# Patient Record
Sex: Female | Born: 1960 | Race: White | Hispanic: No | Marital: Married | Smoking: Never smoker
Health system: Southern US, Community
[De-identification: ages and names within clinical notes are randomized; demographics above are authoritative.]

## PROBLEM LIST (undated history)

## (undated) DIAGNOSIS — R569 Unspecified convulsions: Secondary | ICD-10-CM

## (undated) DIAGNOSIS — M1990 Unspecified osteoarthritis, unspecified site: Secondary | ICD-10-CM

## (undated) DIAGNOSIS — F909 Attention-deficit hyperactivity disorder, unspecified type: Secondary | ICD-10-CM

## (undated) DIAGNOSIS — Z8489 Family history of other specified conditions: Secondary | ICD-10-CM

## (undated) HISTORY — PX: BREAST SURGERY: SHX581

## (undated) HISTORY — PX: HAND SURGERY: SHX662

---

## 2005-08-09 ENCOUNTER — Ambulatory Visit: Payer: Self-pay | Admitting: Unknown Physician Specialty

## 2011-01-23 ENCOUNTER — Encounter: Payer: Self-pay | Admitting: Rheumatology

## 2011-01-30 ENCOUNTER — Encounter: Payer: Self-pay | Admitting: Rheumatology

## 2013-05-13 ENCOUNTER — Ambulatory Visit: Payer: Self-pay | Admitting: Gastroenterology

## 2013-05-14 LAB — PATHOLOGY REPORT

## 2017-04-17 ENCOUNTER — Other Ambulatory Visit: Payer: Self-pay | Admitting: Rheumatology

## 2017-04-17 DIAGNOSIS — M25511 Pain in right shoulder: Principal | ICD-10-CM

## 2017-04-17 DIAGNOSIS — G8929 Other chronic pain: Secondary | ICD-10-CM

## 2017-04-20 ENCOUNTER — Ambulatory Visit
Admission: RE | Admit: 2017-04-20 | Discharge: 2017-04-20 | Disposition: A | Discharge status: Home / Self Care | Payer: BC Managed Care – PPO | Source: Ambulatory Visit | Attending: Rheumatology | Admitting: Rheumatology

## 2017-04-20 ENCOUNTER — Encounter: Payer: Self-pay | Admitting: Radiology

## 2017-04-20 DIAGNOSIS — M25411 Effusion, right shoulder: Secondary | ICD-10-CM | POA: Insufficient documentation

## 2017-04-20 DIAGNOSIS — M25511 Pain in right shoulder: Secondary | ICD-10-CM | POA: Diagnosis present

## 2017-04-20 DIAGNOSIS — M7551 Bursitis of right shoulder: Secondary | ICD-10-CM | POA: Insufficient documentation

## 2017-04-20 DIAGNOSIS — G8929 Other chronic pain: Secondary | ICD-10-CM

## 2019-08-13 ENCOUNTER — Other Ambulatory Visit: Payer: Self-pay | Admitting: Surgery

## 2019-09-02 ENCOUNTER — Other Ambulatory Visit
Admission: RE | Admit: 2019-09-02 | Discharge: 2019-09-02 | Disposition: A | Discharge status: Home / Self Care | Payer: BC Managed Care – PPO | Source: Ambulatory Visit | Attending: Surgery | Admitting: Surgery

## 2019-09-02 ENCOUNTER — Other Ambulatory Visit: Payer: Self-pay

## 2019-09-02 DIAGNOSIS — Z01810 Encounter for preprocedural cardiovascular examination: Secondary | ICD-10-CM | POA: Insufficient documentation

## 2019-09-02 DIAGNOSIS — R001 Bradycardia, unspecified: Secondary | ICD-10-CM | POA: Diagnosis not present

## 2019-09-02 DIAGNOSIS — Z01812 Encounter for preprocedural laboratory examination: Secondary | ICD-10-CM | POA: Diagnosis present

## 2019-09-02 HISTORY — DX: Unspecified osteoarthritis, unspecified site: M19.90

## 2019-09-02 LAB — URINALYSIS, ROUTINE W REFLEX MICROSCOPIC
Bacteria, UA: NONE SEEN
Bilirubin Urine: NEGATIVE
Glucose, UA: NEGATIVE mg/dL
Ketones, ur: NEGATIVE mg/dL
Leukocytes,Ua: NEGATIVE
Nitrite: NEGATIVE
Protein, ur: NEGATIVE mg/dL
Specific Gravity, Urine: 1.01 (ref 1.005–1.030)
Squamous Epithelial / HPF: NONE SEEN (ref 0–5)
WBC, UA: NONE SEEN WBC/hpf (ref 0–5)
pH: 5 (ref 5.0–8.0)

## 2019-09-02 LAB — COMPREHENSIVE METABOLIC PANEL
ALT: 20 U/L (ref 0–44)
AST: 23 U/L (ref 15–41)
Albumin: 3.9 g/dL (ref 3.5–5.0)
Alkaline Phosphatase: 46 U/L (ref 38–126)
Anion gap: 8 (ref 5–15)
BUN: 16 mg/dL (ref 6–20)
CO2: 25 mmol/L (ref 22–32)
Calcium: 8.5 mg/dL — ABNORMAL LOW (ref 8.9–10.3)
Chloride: 102 mmol/L (ref 98–111)
Creatinine, Ser: 0.61 mg/dL (ref 0.44–1.00)
GFR calc Af Amer: >60 mL/min (ref >60)
GFR calc non Af Amer: >60 mL/min (ref >60)
Glucose, Bld: 94 mg/dL (ref 70–99)
Potassium: 4.3 mmol/L (ref 3.5–5.1)
Sodium: 135 mmol/L (ref 135–145)
Total Bilirubin: 0.6 mg/dL (ref 0.3–1.2)
Total Protein: 6.8 g/dL (ref 6.5–8.1)

## 2019-09-02 LAB — CBC WITH DIFFERENTIAL/PLATELET
Abs Immature Granulocytes: 0.01 10*3/uL (ref 0.00–0.07)
Basophils Absolute: 0 10*3/uL (ref 0.0–0.1)
Basophils Relative: 1 %
Eosinophils Absolute: 0 10*3/uL (ref 0.0–0.5)
Eosinophils Relative: 1 %
HCT: 38.2 % (ref 36.0–46.0)
Hemoglobin: 12.8 g/dL (ref 12.0–15.0)
Immature Granulocytes: 0 %
Lymphocytes Relative: 21 %
Lymphs Abs: 0.9 10*3/uL (ref 0.7–4.0)
MCH: 30.4 pg (ref 26.0–34.0)
MCHC: 33.5 g/dL (ref 30.0–36.0)
MCV: 90.7 fL (ref 80.0–100.0)
Monocytes Absolute: 0.8 10*3/uL (ref 0.1–1.0)
Monocytes Relative: 18 %
Neutro Abs: 2.5 10*3/uL (ref 1.7–7.7)
Neutrophils Relative %: 59 %
Platelets: 165 10*3/uL (ref 150–400)
RBC: 4.21 MIL/uL (ref 3.87–5.11)
RDW: 11.9 % (ref 11.5–15.5)
WBC: 4.2 10*3/uL (ref 4.0–10.5)
nRBC: 0 % (ref 0.0–0.2)

## 2019-09-02 LAB — TYPE AND SCREEN
ABO/RH(D): B POS
Antibody Screen: NEGATIVE

## 2019-09-02 LAB — SURGICAL PCR SCREEN
MRSA, PCR: NEGATIVE
Staphylococcus aureus: NEGATIVE

## 2019-09-02 NOTE — Pre-Procedure Instructions (Signed)
Pt states she has some normal sinus issues. Temperature is monitor daily. Pt instructed to notify MD if symptoms worsen.

## 2019-09-02 NOTE — Patient Instructions (Signed)
Your procedure is scheduled on: Tuesday September 08, 2019 Report to Day Surgery on the 2nd floor of the [facility]. To find out your arrival time, please call [phone] between 1PM - 3PM on: Monday September 07, 2019  REMEMBER: Instructions that are not followed completely may result in serious medical risk, up to and including death; or upon the discretion of your surgeon and anesthesiologist your surgery may need to be rescheduled.  Do not eat food after midnight the night before surgery.  No gum chewing, lozengers or hard candies.  You may however, drink CLEAR liquids up to 2 hours before you are scheduled to arrive for your surgery. Do not drink anything within 2 hours of the start of your surgery.  Clear liquids include: - water  - apple juice without pulp - CLEAR gatorade - black coffee or tea (Do NOT add milk or creamers to the coffee or tea) Do NOT drink anything that is not on this list.  Type 1 and Type 2 diabetics should only drink water.  ENSURE PRE-SURGERY CARBOHYDRATE DRINK:  Complete drinking 3 hours prior to surgery.  No Alcohol for 24 hours before or after surgery.  No Smoking including e-cigarettes for 24 hours prior to surgery.  No chewable tobacco products for at least 6 hours prior to surgery.  No nicotine patches on the day of surgery.  On the morning of surgery brush your teeth with toothpaste and water, you may rinse your mouth with mouthwash if you wish. Do not swallow any toothpaste or mouthwash.  Notify your doctor if there is any change in your medical condition (cold, fever, infection).  Do not wear jewelry, make-up, hairpins, clips or nail polish.  Do not wear lotions, powders, or perfumes OR DEODORANT  Do not shave 48 hours prior to surgery.   Contacts and dentures may not be worn into surgery.  Do not bring valuables to the hospital, including drivers license, insurance or credit cards.  [facility] is not responsible for any belongings  or valuables.   TAKE THESE MEDICATIONS THE MORNING OF SURGERY: ZOLOFT  Use CHG Soap  as directed on instruction sheet.  Stop Anti-inflammatories (NSAIDS) such as Advil, Aleve, Ibuprofen, Motrin, Naproxen, Naprosyn and Aspirin based products such as Excedrin, Goodys Powder, BC Powder. (May take Tylenol or Acetaminophen if needed.)  Stop ANY OVER THE COUNTER supplements until after surgery. (May continue Vitamin D, Vitamin B, and multivitamin.)  Wear comfortable clothing (specific to your surgery type) to the hospital.  Plan for stool softeners for home use.  If you are being admitted to the hospital overnight, leave your suitcase in the car. After surgery it may be brought to your room.  If you are being discharged the day of surgery, you will not be allowed to drive home. You will need a responsible adult to drive you home and stay with you that night.   If you are taking public transportation, you will need to have a responsible adult with you. Please confirm with your physician that it is acceptable to use public transportation.   Please call [phone] if you have any questions about these instructions.

## 2019-09-02 NOTE — Pre-Procedure Instructions (Signed)
Abnormal urinalysis faxed to Dr Roland Rack.

## 2019-09-04 ENCOUNTER — Other Ambulatory Visit
Admission: RE | Admit: 2019-09-04 | Discharge: 2019-09-04 | Disposition: A | Discharge status: Home / Self Care | Payer: BC Managed Care – PPO | Source: Ambulatory Visit | Attending: Surgery | Admitting: Surgery

## 2019-09-04 DIAGNOSIS — Z01812 Encounter for preprocedural laboratory examination: Secondary | ICD-10-CM | POA: Insufficient documentation

## 2019-09-04 DIAGNOSIS — U071 COVID-19: Secondary | ICD-10-CM | POA: Diagnosis not present

## 2019-09-04 LAB — SARS CORONAVIRUS 2 (TAT 6-24 HRS): SARS Coronavirus 2: POSITIVE — AB

## 2019-09-07 NOTE — Progress Notes (Signed)
Called and left message on [facility] line (Dr Poggi's surgery coordinator) about patients positive COVID-19 test.

## 2019-09-08 ENCOUNTER — Encounter: Admission: RE | Payer: Self-pay | Source: Home / Self Care

## 2019-09-08 ENCOUNTER — Inpatient Hospital Stay: Admission: RE | Admit: 2019-09-08 | Payer: BC Managed Care – PPO | Source: Home / Self Care | Admitting: Surgery

## 2019-09-08 SURGERY — ARTHROPLASTY, HIP, TOTAL,POSTERIOR APPROACH
Anesthesia: Choice | Site: Hip | Laterality: Right

## 2019-09-22 ENCOUNTER — Other Ambulatory Visit: Payer: Self-pay | Admitting: Surgery

## 2019-10-01 ENCOUNTER — Other Ambulatory Visit: Payer: BC Managed Care – PPO

## 2019-10-05 NOTE — Pre-Procedure Instructions (Signed)
Talked to patient to clarify instructions for upcoming surgery.  We will repeat the T&S and [place] on DOS but previous labs are OK for surgical case. States never had COVID symptoms. But discovered she was positive

## 2019-10-06 ENCOUNTER — Ambulatory Visit: Payer: BC Managed Care – PPO

## 2019-10-06 ENCOUNTER — Other Ambulatory Visit: Payer: Self-pay

## 2019-10-06 ENCOUNTER — Ambulatory Visit
Admission: RE | Admit: 2019-10-06 | Discharge: 2019-10-06 | Disposition: A | Discharge status: Home / Self Care | Payer: BC Managed Care – PPO | Attending: Surgery | Admitting: Surgery

## 2019-10-06 ENCOUNTER — Encounter: Payer: Self-pay | Admitting: Surgery

## 2019-10-06 ENCOUNTER — Ambulatory Visit: Payer: BC Managed Care – PPO | Admitting: Family

## 2019-10-06 ENCOUNTER — Encounter
Admission: RE | Disposition: A | Discharge status: Home / Self Care | Payer: Self-pay | Source: Home / Self Care | Attending: Surgery

## 2019-10-06 DIAGNOSIS — Z809 Family history of malignant neoplasm, unspecified: Secondary | ICD-10-CM | POA: Diagnosis not present

## 2019-10-06 DIAGNOSIS — Z885 Allergy status to narcotic agent status: Secondary | ICD-10-CM | POA: Insufficient documentation

## 2019-10-06 DIAGNOSIS — Z79899 Other long term (current) drug therapy: Secondary | ICD-10-CM | POA: Diagnosis not present

## 2019-10-06 DIAGNOSIS — Z8261 Family history of arthritis: Secondary | ICD-10-CM | POA: Insufficient documentation

## 2019-10-06 DIAGNOSIS — M1611 Unilateral primary osteoarthritis, right hip: Secondary | ICD-10-CM | POA: Diagnosis present

## 2019-10-06 DIAGNOSIS — Z96641 Presence of right artificial hip joint: Secondary | ICD-10-CM

## 2019-10-06 HISTORY — PX: TOTAL HIP ARTHROPLASTY: SHX124

## 2019-10-06 LAB — TYPE AND SCREEN
ABO/RH(D): B POS
Antibody Screen: NEGATIVE

## 2019-10-06 SURGERY — ARTHROPLASTY, HIP, TOTAL,POSTERIOR APPROACH
Anesthesia: Spinal | Site: Hip | Laterality: Right

## 2019-10-06 MED ORDER — CEFAZOLIN SODIUM-DEXTROSE 2-4 GM/100ML-% IV SOLN: 2.0000 g | Freq: Four times a day (QID) | INTRAVENOUS | Status: DC

## 2019-10-06 MED ORDER — APIXABAN 2.5 MG PO TABS: 2.5000 mg | ORAL_TABLET | Freq: Two times a day (BID) | ORAL | 0 refills | Status: DC

## 2019-10-06 MED ORDER — PROMETHAZINE HCL 25 MG/ML IJ SOLN: 6.2500 mg | INTRAMUSCULAR | Status: DC | PRN

## 2019-10-06 MED ORDER — ACETAMINOPHEN 500 MG PO TABS: 1000.0000 mg | ORAL_TABLET | Freq: Four times a day (QID) | ORAL | Status: DC

## 2019-10-06 MED ORDER — OXYCODONE HCL 5 MG PO TABS: 5.0000 mg | ORAL_TABLET | ORAL | 0 refills | Status: DC | PRN

## 2019-10-06 MED ORDER — ACETAMINOPHEN 500 MG PO TABS
ORAL_TABLET | ORAL | Status: AC
  Administered 2019-10-06: 1000 mg via ORAL
  Filled 2019-10-06: qty 2

## 2019-10-06 MED ORDER — ONDANSETRON HCL 4 MG PO TABS: 4.0000 mg | ORAL_TABLET | Freq: Four times a day (QID) | ORAL | Status: DC | PRN

## 2019-10-06 MED ORDER — FAMOTIDINE 20 MG PO TABS
ORAL_TABLET | ORAL | Status: AC
  Administered 2019-10-06: 20 mg via ORAL
  Filled 2019-10-06: qty 1

## 2019-10-06 MED ORDER — KETOROLAC TROMETHAMINE 30 MG/ML IJ SOLN
INTRAMUSCULAR | Status: AC
  Administered 2019-10-06: 30 mg via INTRAVENOUS
  Filled 2019-10-06: qty 1

## 2019-10-06 MED ORDER — CEFAZOLIN SODIUM-DEXTROSE 2-4 GM/100ML-% IV SOLN
INTRAVENOUS | Status: AC
  Administered 2019-10-06: 2000 mg
  Filled 2019-10-06: qty 100

## 2019-10-06 MED ORDER — MIDAZOLAM HCL 5 MG/5ML IJ SOLN
INTRAMUSCULAR | Status: DC | PRN
  Administered 2019-10-06: 2 mg via INTRAVENOUS

## 2019-10-06 MED ORDER — CEFAZOLIN SODIUM-DEXTROSE 2-4 GM/100ML-% IV SOLN
2.0000 g | INTRAVENOUS | Status: AC
  Administered 2019-10-06: 2 g via INTRAVENOUS

## 2019-10-06 MED ORDER — OXYCODONE HCL 5 MG PO TABS: 5.0000 mg | ORAL_TABLET | ORAL | Status: DC | PRN

## 2019-10-06 MED ORDER — MIDAZOLAM HCL 2 MG/2ML IJ SOLN
INTRAMUSCULAR | Status: AC
  Filled 2019-10-06: qty 2

## 2019-10-06 MED ORDER — EPINEPHRINE PF 1 MG/ML IJ SOLN
INTRAMUSCULAR | Status: AC
  Filled 2019-10-06: qty 1

## 2019-10-06 MED ORDER — SODIUM CHLORIDE FLUSH 0.9 % IV SOLN
INTRAVENOUS | Status: AC
  Filled 2019-10-06: qty 40

## 2019-10-06 MED ORDER — PROPOFOL 500 MG/50ML IV EMUL
INTRAVENOUS | Status: DC | PRN
  Administered 2019-10-06: 50 ug/kg/min via INTRAVENOUS

## 2019-10-06 MED ORDER — PHENYLEPHRINE HCL (PRESSORS) 10 MG/ML IV SOLN
INTRAVENOUS | Status: DC | PRN
  Administered 2019-10-06: 50 ug via INTRAVENOUS

## 2019-10-06 MED ORDER — FENTANYL CITRATE (PF) 100 MCG/2ML IJ SOLN: 25.0000 ug | INTRAMUSCULAR | Status: DC | PRN

## 2019-10-06 MED ORDER — PROPOFOL 500 MG/50ML IV EMUL
INTRAVENOUS | Status: AC
  Filled 2019-10-06: qty 50

## 2019-10-06 MED ORDER — BUPIVACAINE LIPOSOME 1.3 % IJ SUSP
INTRAMUSCULAR | Status: AC
  Filled 2019-10-06: qty 20

## 2019-10-06 MED ORDER — TRAMADOL HCL 50 MG PO TABS
ORAL_TABLET | ORAL | Status: AC
  Filled 2019-10-06: qty 1

## 2019-10-06 MED ORDER — FENTANYL CITRATE (PF) 100 MCG/2ML IJ SOLN
INTRAMUSCULAR | Status: AC
  Filled 2019-10-06: qty 2

## 2019-10-06 MED ORDER — CHLORHEXIDINE GLUCONATE 4 % EX LIQD: 60.0000 mL | Freq: Once | CUTANEOUS | Status: DC

## 2019-10-06 MED ORDER — CEFAZOLIN SODIUM-DEXTROSE 2-4 GM/100ML-% IV SOLN
INTRAVENOUS | Status: AC
  Filled 2019-10-06: qty 100

## 2019-10-06 MED ORDER — POTASSIUM CHLORIDE IN NACL 20-0.9 MEQ/L-% IV SOLN: INTRAVENOUS | Status: DC

## 2019-10-06 MED ORDER — SODIUM CHLORIDE 0.9 % IV SOLN
INTRAVENOUS | Status: DC | PRN
  Administered 2019-10-06: 60 mL

## 2019-10-06 MED ORDER — EPHEDRINE SULFATE 50 MG/ML IJ SOLN
INTRAMUSCULAR | Status: DC | PRN
  Administered 2019-10-06 (×2): 10 mg via INTRAVENOUS
  Administered 2019-10-06: 5 mg via INTRAVENOUS

## 2019-10-06 MED ORDER — FAMOTIDINE 20 MG PO TABS: 20.0000 mg | ORAL_TABLET | Freq: Once | ORAL | Status: AC

## 2019-10-06 MED ORDER — TRANEXAMIC ACID 1000 MG/10ML IV SOLN
INTRAVENOUS | Status: AC
  Filled 2019-10-06: qty 10

## 2019-10-06 MED ORDER — BUPIVACAINE HCL (PF) 0.5 % IJ SOLN
INTRAMUSCULAR | Status: DC | PRN
  Administered 2019-10-06: 2.5 mL

## 2019-10-06 MED ORDER — KETOROLAC TROMETHAMINE 30 MG/ML IJ SOLN: 30.0000 mg | Freq: Once | INTRAMUSCULAR | Status: AC

## 2019-10-06 MED ORDER — KETOROLAC TROMETHAMINE 15 MG/ML IJ SOLN: 15.0000 mg | Freq: Four times a day (QID) | INTRAMUSCULAR | Status: DC

## 2019-10-06 MED ORDER — LACTATED RINGERS IV SOLN: INTRAVENOUS | Status: DC

## 2019-10-06 MED ORDER — METOCLOPRAMIDE HCL 5 MG/ML IJ SOLN: 5.0000 mg | Freq: Three times a day (TID) | INTRAMUSCULAR | Status: DC | PRN

## 2019-10-06 MED ORDER — BUPIVACAINE HCL (PF) 0.5 % IJ SOLN
INTRAMUSCULAR | Status: AC
  Filled 2019-10-06: qty 30

## 2019-10-06 MED ORDER — SODIUM CHLORIDE 0.9 % BOLUS PEDS
250.0000 mL | Freq: Once | INTRAVENOUS | Status: AC
  Administered 2019-10-06: 10:00:00 250 mL via INTRAVENOUS

## 2019-10-06 MED ORDER — ONDANSETRON HCL 4 MG/2ML IJ SOLN: 4.0000 mg | Freq: Four times a day (QID) | INTRAMUSCULAR | Status: DC | PRN

## 2019-10-06 MED ORDER — METOCLOPRAMIDE HCL 10 MG PO TABS: 5.0000 mg | ORAL_TABLET | Freq: Three times a day (TID) | ORAL | Status: DC | PRN

## 2019-10-06 MED ORDER — BUPIVACAINE-EPINEPHRINE (PF) 0.5% -1:200000 IJ SOLN
INTRAMUSCULAR | Status: DC | PRN
  Administered 2019-10-06: 30 mL via PERINEURAL

## 2019-10-06 MED ORDER — OXYCODONE HCL 5 MG PO TABS
ORAL_TABLET | ORAL | Status: AC
  Administered 2019-10-06: 5 mg via ORAL
  Filled 2019-10-06: qty 1

## 2019-10-06 MED ORDER — TRAMADOL HCL 50 MG PO TABS: 50.0000 mg | ORAL_TABLET | Freq: Four times a day (QID) | ORAL | 0 refills | Status: AC | PRN

## 2019-10-06 MED ORDER — SODIUM CHLORIDE 0.9 % BOLUS PEDS
250.0000 mL | Freq: Once | INTRAVENOUS | Status: AC
  Administered 2019-10-06: 250 mL via INTRAVENOUS

## 2019-10-06 SURGICAL SUPPLY — 56 items
BLADE SAGITTAL WIDE XTHICK NO (BLADE) ×3 IMPLANT
BLADE SURG SZ20 CARB STEEL (BLADE) ×3 IMPLANT
CANISTER SUCT 1200ML W/VALVE (MISCELLANEOUS) ×3 IMPLANT
CANISTER SUCT 3000ML PPV (MISCELLANEOUS) ×6 IMPLANT
CHLORAPREP W/TINT 26 (MISCELLANEOUS) ×3 IMPLANT
COVER WAND RF STERILE (DRAPES) ×3 IMPLANT
DRAPE 3/4 80X56 (DRAPES) ×3 IMPLANT
DRAPE INCISE IOBAN 66X60 STRL (DRAPES) ×3 IMPLANT
DRAPE SPLIT 6X30 W/TAPE (DRAPES) ×3 IMPLANT
DRAPE SURG 17X11 SM STRL (DRAPES) ×6 IMPLANT
DRSG OPSITE POSTOP 4X10 (GAUZE/BANDAGES/DRESSINGS) ×3 IMPLANT
ELECT BLADE 6.5 EXT (BLADE) ×3 IMPLANT
ELECT CAUTERY BLADE 6.4 (BLADE) ×3 IMPLANT
GLOVE BIO SURGEON STRL SZ7.5 (GLOVE) ×12 IMPLANT
GLOVE BIO SURGEON STRL SZ8 (GLOVE) ×12 IMPLANT
GLOVE BIOGEL PI IND STRL 8 (GLOVE) ×1 IMPLANT
GLOVE BIOGEL PI INDICATOR 8 (GLOVE) ×2
GLOVE INDICATOR 8.0 STRL GRN (GLOVE) ×3 IMPLANT
GOWN STRL REUS W/ TWL LRG LVL3 (GOWN DISPOSABLE) ×1 IMPLANT
GOWN STRL REUS W/ TWL XL LVL3 (GOWN DISPOSABLE) ×1 IMPLANT
GOWN STRL REUS W/TWL LRG LVL3 (GOWN DISPOSABLE) ×2
GOWN STRL REUS W/TWL XL LVL3 (GOWN DISPOSABLE) ×2
HEAD FEM -6XOFST 36XMDLR (Orthopedic Implant) ×1 IMPLANT
HEAD MODULAR 36MM (Orthopedic Implant) ×2 IMPLANT
HIP SHELL ACETAB 3H 50MM (Hips) ×3 IMPLANT
HOOD PEEL AWAY FLYTE STAYCOOL (MISCELLANEOUS) ×9 IMPLANT
KIT TURNOVER KIT A (KITS) ×3 IMPLANT
LINER ACE G7 36 SZ D HIGH WALL (Liner) ×3 IMPLANT
NDL SAFETY ECLIPSE 18X1.5 (NEEDLE) ×2 IMPLANT
NEEDLE FILTER BLUNT 18X 1/2SAF (NEEDLE) ×2
NEEDLE FILTER BLUNT 18X1 1/2 (NEEDLE) ×1 IMPLANT
NEEDLE HYPO 18GX1.5 SHARP (NEEDLE) ×4
NEEDLE SPNL 20GX3.5 QUINCKE YW (NEEDLE) ×3 IMPLANT
PACK HIP PROSTHESIS (MISCELLANEOUS) ×3 IMPLANT
PILLOW ABDUCTION FOAM SM (MISCELLANEOUS) ×3 IMPLANT
PIN STEINMAN 3/16 (PIN) ×3 IMPLANT
PIN STEINMANN 3/16X9 BAY 6PK (Pin) ×1 IMPLANT
PULSAVAC PLUS IRRIG FAN TIP (DISPOSABLE) ×3
SHELL ACETAB HIP 3H 50MM (Hips) ×1 IMPLANT
SOL .9 NS 3000ML IRR  AL (IV SOLUTION) ×2
SOL .9 NS 3000ML IRR UROMATIC (IV SOLUTION) ×1 IMPLANT
SPONGE LAP 18X18 RF (DISPOSABLE) ×3 IMPLANT
ST PIN 3/16X9 BAY 6PK (Pin) ×3 IMPLANT
STAPLER SKIN PROX 35W (STAPLE) ×3 IMPLANT
STEM COLLARLESS 11MM X 135MM (Stem) ×3 IMPLANT
SUT TICRON 2-0 30IN 311381 (SUTURE) ×9 IMPLANT
SUT VIC AB 0 CT1 36 (SUTURE) ×3 IMPLANT
SUT VIC AB 1 CT1 36 (SUTURE) ×3 IMPLANT
SUT VIC AB 2-0 CT1 (SUTURE) ×9 IMPLANT
SYR 10ML LL (SYRINGE) ×3 IMPLANT
SYR 20ML LL LF (SYRINGE) ×3 IMPLANT
SYR 30ML LL (SYRINGE) ×6 IMPLANT
TAPE TRANSPORE STRL 2 31045 (GAUZE/BANDAGES/DRESSINGS) ×3 IMPLANT
TIP FAN IRRIG PULSAVAC PLUS (DISPOSABLE) ×1 IMPLANT
TRAY FOLEY MTR SLVR 16FR STAT (SET/KITS/TRAYS/PACK) IMPLANT
WATER STERILE IRR 1000ML POUR (IV SOLUTION) ×3 IMPLANT

## 2019-10-06 NOTE — Anesthesia Preprocedure Evaluation (Signed)
Anesthesia Evaluation  Patient identified by MRN, date of birth, ID band Patient awake    Reviewed: Allergy & Precautions, H&P , NPO status , Patient's Chart, lab work & pertinent test results, reviewed documented beta blocker date and time   History of Anesthesia Complications Negative for: history of anesthetic complications  Airway Mallampati: II  TM Distance: >3 FB Neck ROM: full    Dental  (+) Dental Advidsory Given, Caps, Teeth Intact   Pulmonary neg pulmonary ROS,    Pulmonary exam normal        Cardiovascular Exercise Tolerance: Good negative cardio ROS Normal cardiovascular exam     Neuro/Psych negative neurological ROS  negative psych ROS   GI/Hepatic negative GI ROS, Neg liver ROS,   Endo/Other  negative endocrine ROS  Renal/GU negative Renal ROS  negative genitourinary   Musculoskeletal   Abdominal   Peds  Hematology negative hematology ROS (+)   Anesthesia Other Findings Past Medical History: No date: Arthritis   Reproductive/Obstetrics negative OB ROS                             Anesthesia Physical Anesthesia Plan  ASA: I  Anesthesia Plan: Spinal   Post-op Pain Management:    Induction:   PONV Risk Score and Plan: Propofol infusion and TIVA  Airway Management Planned: Natural Airway and Simple Face Mask  Additional Equipment:   Intra-op Plan:   Post-operative Plan:   Informed Consent: I have reviewed the patients History and Physical, chart, labs and discussed the procedure including the risks, benefits and alternatives for the proposed anesthesia with the patient or authorized representative who has indicated his/her understanding and acceptance.     Dental Advisory Given  Plan Discussed with: Anesthesiologist, CRNA and Surgeon  Anesthesia Plan Comments:         Anesthesia Quick Evaluation

## 2019-10-06 NOTE — Progress Notes (Signed)
Physical Therapy Treatment Patient Details Name: Maria Donaldson MRN: [id number] DOB: [birth year] Today's Date: 10/06/2019    History of Present Illness Pt is a 59 y.o. 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  PMH includes arthritis.    PT Comments    Mild increased R quadriceps contraction noted but significant R quadriceps weakness still present (pt unable to perform SAQ or LAQ).  Per discussion with MD Poggi, plan to utilize R knee immobilizer with functional mobility d/t R quadriceps weakness to prevent R knee buckling (plan to discharge pt home with KI per discussion with MD Poggi).  Pt reporting 4/10 R hip pain during session (pt reports pain improved a lot compared to prior to surgery).  Pt educated on R LE knee immobilizer use including donning and doffing, positioning of KI, and wear instructions (therapist recommended to pt to wear R knee immobilizer for all functional mobility until follow-up with physical therapy for safety--R quadriceps strength to be reassessed with HHPT to determine need for continued KI use): pt verbalizing appropriate understanding.  Reviewed posterior hip precautions (including safe R LE positioning at rest and with activity d/t posterior hip precautions): pt verbalizing appropriate understanding.  Able to walk at least 80 feet with RW CGA (R KI donned) and navigate steps safely (with R KI donned)--see below for details.  Pt reports plan to keep BSC next to her bed at night.  Car transfers reviewed in prior PT session this afternoon and pt verbalizing appropriate understanding.  Pt reporting no concerns with discharge home today and pt able to appropriately verbalize safety precautions; pt also reports her husband is able to assist her with stairs, bed mobility, and anything else she needs for safe mobility and safe discharge home.  Youth sized RW issued to pt (and fit to pt for appropriate size).   Follow Up Recommendations  Home health PT;Supervision/Assistance - 24  hour     Equipment Recommendations  Rolling walker with 5" wheels(youth sized)    Recommendations for Other Services       Precautions / Restrictions Precautions Precautions: Posterior Hip;Fall Precaution Booklet Issued: Yes (comment) Restrictions Weight Bearing Restrictions: Yes RLE Weight Bearing: Weight bearing as tolerated(with walker)    Mobility  Bed Mobility          Transfers Overall transfer level: Needs assistance Equipment used: Rolling walker (2 wheeled) Transfers: Sit to/from Stand;Stand Pivot Transfers Sit to Stand: Supervision Stand pivot transfers: Supervision       General transfer comment: initial vc's for UE/LE placement and posterior hip precautions but then pt able to perform safely without further cueing  Ambulation/Gait Ambulation/Gait assistance: Min guard Gait Distance (Feet): 80 Feet Assistive device: Rolling walker (2 wheeled)(youth sized)   Gait velocity: decreased   General Gait Details: pt noted with difficulty coordinating stepping with R LE (pt reporting d/t numbness still in R LE) so pt given cueing to take shorter steps and improved gait technique noted; steady with RW; KI utilized   Stairs Stairs: Yes Stairs assistance: Min guard Stair Management: Step to pattern;Forwards Number of Stairs: (4 steps; 2 steps) General stair comments: ascended/descended 2 steps with B railings and then 2 steps with RW (forwards) with initial cueing for technique; then ascended 2 steps with R hand hold assist (and using L UE simulating pushing against wall) and then descended 2 steps with B UE support on therapists shoulders; KI utilized for steps   Wheelchair Mobility    Modified Rankin (Stroke Patients Only)  Balance Overall balance assessment: Needs assistance Sitting-balance support: No upper extremity supported;Feet supported Sitting balance-Leahy Scale: Normal Sitting balance - Comments: steady sitting reaching outside BOS    Standing balance support: No upper extremity supported Standing balance-Leahy Scale: Good Standing balance comment: steady standing reaching within BOS (with R KI donned)                            Cognition Arousal/Alertness: Awake/alert Behavior During Therapy: WFL for tasks assessed/performed Overall Cognitive Status: Within Functional Limits for tasks assessed                                        Exercises     General Comments        Pertinent Vitals/Pain Pain Assessment: 0-10 Pain Score: 4  Pain Location: R hip/thigh Pain Descriptors / Indicators: Sore Pain Intervention(s): Limited activity within patient's tolerance;Monitored during session;Repositioned;RN gave pain meds during session;Ice applied  Vitals (HR and O2 on room air) stable and WFL throughout treatment session.    Home Living Family/patient expects to be discharged to:: Private residence Living Arrangements: Spouse/significant other Available Help at Discharge: Family Type of Home: House Home Access: Stairs to enter Entrance Stairs-Rails: None Home Layout: (split level type home) Home Equipment: Bedside commode;Grab bars - tub/shower      Prior Function Level of Independence: Independent      Comments: Works as a Oncologist.   PT Goals (current goals can now be found in the care plan section) Acute Rehab PT Goals Patient Stated Goal: to go home PT Goal Formulation: With patient Time For Goal Achievement: 10/20/19 Potential to Achieve Goals: Good Progress towards PT goals: Progressing toward goals    Frequency    BID      PT Plan Current plan remains appropriate    Co-evaluation              AM-PAC PT "6 Clicks" Mobility   Outcome Measure  Help needed turning from your back to your side while in a flat bed without using bedrails?: A Little Help needed moving from lying on your back to sitting on the side of a flat bed without using  bedrails?: A Little Help needed moving to and from a bed to a chair (including a wheelchair)?: A Little Help needed standing up from a chair using your arms (e.g., wheelchair or bedside chair)?: A Little Help needed to walk in hospital room?: A Little Help needed climbing 3-5 steps with a railing? : A Little 6 Click Score: 18    End of Session Equipment Utilized During Treatment: Gait belt Activity Tolerance: Patient tolerated treatment well Patient left: in chair;with call bell/phone within reach;with nursing/sitter in room Nurse Communication: Mobility status;Precautions;Weight bearing status;Other (comment)(R KI use) PT Visit Diagnosis: Other abnormalities of gait and mobility (R26.89);Muscle weakness (generalized) (M62.81);Difficulty in walking, not elsewhere classified (R26.2);Pain Pain - Right/Left: Right Pain - part of body: Hip     Time: 1545-1630 PT Time Calculation (min) (ACUTE ONLY): 45 min  Charges:  $Gait Training: 23-37 mins $Therapeutic Activity: 8-22 mins                     Leitha Bleak, PT 10/06/19, 5:55 PM

## 2019-10-06 NOTE — Anesthesia Procedure Notes (Signed)
Spinal  Patient location during procedure: OR Start time: 10/06/2019 7:39 AM End time: 10/06/2019 7:42 AM Staffing Resident/CRNA: Gentry Fitz, CRNA Preanesthetic Checklist Completed: patient identified, IV checked, site marked, risks and benefits discussed, surgical consent, monitors and equipment checked, pre-op evaluation and timeout performed Spinal Block Patient position: sitting Prep: Betadine Patient monitoring: heart rate, continuous pulse ox, cardiac monitor and blood pressure Approach: midline Location: L3-4 Injection technique: single-shot Needle Needle type: Pencan  Needle gauge: 24 G Needle length: 10 cm Assessment Sensory level: T10 Additional Notes Patient tolerated procedure well. 2.60ml of 0.5% bupivicaine placed smoothly. No pain/parathesia noted throughout procedure. Spinal placed x1 attempt  Pencan spinal needle tray Lot#: [id number] Exp: 30 Nov 21

## 2019-10-06 NOTE — Evaluation (Signed)
Physical Therapy Evaluation Patient Details Name: Maria Donaldson MRN: [id number] DOB: [birth year] Today's Date: 10/06/2019   History of Present Illness  Pt is a 59 y.o. 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  PMH includes arthritis.  Clinical Impression  Prior to hospital admission, pt was independent and working; lives with her husband.  Currently pt is min assist with bed mobility and CGA with transfers (RW use); pt unable to advance L LE to ambulate d/t R knee buckling (with walker use).  Decreased sensation noted medial R lower leg and impaired quadriceps strength noted (MD Poggi notified) which complicated progression of mobility.  2-3/10 R hip pain during session.  Tolerated LE ex's fairly well.  Pt educated on posterior hip precautions and pt verbalizing understanding.  Pt would benefit from skilled PT to address noted impairments and functional limitations (see below for any additional details).  Upon hospital discharge, pt would benefit from [place] (pt reports plan to start HHPT tomorrow).    Follow Up Recommendations Home health PT;Supervision/Assistance - 24 hour    Equipment Recommendations  Rolling walker with 5" wheels(youth sized)    Recommendations for Other Services       Precautions / Restrictions Precautions Precautions: Posterior Hip;Fall Precaution Booklet Issued: Yes (comment) Restrictions Weight Bearing Restrictions: Yes RLE Weight Bearing: Weight bearing as tolerated(with walker)      Mobility  Bed Mobility Overal bed mobility: Needs Assistance Bed Mobility: Supine to Sit     Supine to sit: Min assist;HOB elevated     General bed mobility comments: vc's for posterior hip precautions and bed mobility technique; assist for R LE  Transfers Overall transfer level: Needs assistance Equipment used: Rolling walker (2 wheeled) Transfers: Sit to/from [facility] Sit to Stand: Min guard         General transfer comment: x2 trials from bed  (standing) and x1 trial from [facility] (standing); vc's for UE placement and walker use; stand pivot bed to [facility] with RW CGA  Ambulation/Gait Ambulation/Gait assistance: Min guard   Assistive device: Rolling walker (2 wheeled)(youth sized)       General Gait Details: pt unable to take a step with L LE d/t R knee buckling with R LE WB'ing although pt was able to side step with 1 assist and walker BSC to recliner a few feet and vc's for increasing UE support through [employer] Mobility    Modified Rankin (Stroke Patients Only)       Balance Overall balance assessment: Needs assistance Sitting-balance support: No upper extremity supported;Feet supported Sitting balance-Leahy Scale: Good Sitting balance - Comments: steady sitting reaching within BOS   Standing balance support: Single extremity supported Standing balance-Leahy Scale: Poor Standing balance comment: pt requiring at least single UE support for static standing balance                             Pertinent Vitals/Pain Pain Assessment: 0-10 Pain Score: 2  Pain Location: R hip/thigh Pain Descriptors / Indicators: Sore Pain Intervention(s): Limited activity within patient's tolerance;Monitored during session;Repositioned  Vitals (HR and O2 on room air) stable and WFL throughout treatment session.    Home Living Family/patient expects to be discharged to:: Private residence Living Arrangements: Spouse/significant other Available Help at Discharge: Family Type of Home: House Home Access: Stairs to enter Entrance Stairs-Rails: None Entrance Stairs-Number of Steps: 1 step to enter home;  2 steps down to bedroom Home Layout: (split level type home) Home Equipment: Bedside commode;Grab bars - tub/shower      Prior Function Level of Independence: Independent         Comments: Works as a Oncologist.     Hand Dominance        Extremity/Trunk Assessment   Upper  Extremity Assessment Upper Extremity Assessment: Overall WFL for tasks assessed    Lower Extremity Assessment Lower Extremity Assessment: RLE deficits/detail;LLE deficits/detail RLE Deficits / Details: poor R quad set contraction; at least 3/5 ankle DF/PF AROM LLE Deficits / Details: strength and ROM WFL    Cervical / Trunk Assessment Cervical / Trunk Assessment: Normal  Communication   Communication: No difficulties  Cognition Arousal/Alertness: Awake/alert Behavior During Therapy: WFL for tasks assessed/performed Overall Cognitive Status: Within Functional Limits for tasks assessed                                        General Comments   Nursing cleared pt for participation in physical therapy.  Pt agreeable to PT session.    Exercises Total Joint Exercises Ankle Circles/Pumps: AROM;Strengthening;Both;10 reps;Supine Quad Sets: AROM;Left;AAROM;Right;Strengthening;10 reps;Supine(vc's and tactile cues required consistently to obtain R quad activation) Gluteal Sets: AROM;Strengthening;Both;10 reps;Supine Towel Squeeze: AROM;Strengthening;Both;10 reps;Supine Short Arc Quad: AAROM;Strengthening;Right;10 reps;Supine Heel Slides: AAROM;Strengthening;Right;10 reps;Supine Hip ABduction/ADduction: AAROM;Strengthening;Right;10 reps;Supine   Assessment/Plan    PT Assessment Patient needs continued PT services  PT Problem List Decreased strength;Decreased activity tolerance;Decreased balance;Decreased mobility;Decreased knowledge of use of DME;Decreased knowledge of precautions;Impaired sensation;Pain;Decreased skin integrity       PT Treatment Interventions DME instruction;Gait training;Stair training;Functional mobility training;Therapeutic activities;Therapeutic exercise;Balance training;Patient/family education    PT Goals (Current goals can be found in the Care Plan section)  Acute Rehab PT Goals Patient Stated Goal: to go home PT Goal Formulation: With  patient Time For Goal Achievement: 10/20/19 Potential to Achieve Goals: Good    Frequency BID   Barriers to discharge        Co-evaluation               AM-PAC PT "6 Clicks" Mobility  Outcome Measure Help needed turning from your back to your side while in a flat bed without using bedrails?: A Little Help needed moving from lying on your back to sitting on the side of a flat bed without using bedrails?: A Little Help needed moving to and from a bed to a chair (including a wheelchair)?: A Little Help needed standing up from a chair using your arms (e.g., wheelchair or bedside chair)?: A Little Help needed to walk in hospital room?: A Lot Help needed climbing 3-5 steps with a railing? : Total 6 Click Score: 15    End of Session Equipment Utilized During Treatment: Gait belt Activity Tolerance: Patient tolerated treatment well Patient left: in chair;with call bell/phone within reach Nurse Communication: Mobility status;Precautions;Weight bearing status;Other (comment)(R quadriceps weakness) PT Visit Diagnosis: Other abnormalities of gait and mobility (R26.89);Muscle weakness (generalized) (M62.81);Difficulty in walking, not elsewhere classified (R26.2);Pain Pain - Right/Left: Right Pain - part of body: Hip    Time: 1410-1445 PT Time Calculation (min) (ACUTE ONLY): 35 min   Charges:   PT Evaluation $PT Eval Low Complexity: 1 Low PT Treatments $Therapeutic Exercise: 8-22 mins        Leitha Bleak, PT 10/06/19, 3:41 PM

## 2019-10-06 NOTE — Discharge Instructions (Addendum)
AMBULATORY SURGERY  DISCHARGE INSTRUCTIONS   1) The drugs that you were given will stay in your system until tomorrow so for the next 24 hours you should not:  A) Drive an automobile B) Make any legal decisions C) Drink any alcoholic beverage   2) You may resume regular meals tomorrow.  Today it is better to start with liquids and gradually work up to solid foods.  You may eat anything you prefer, but it is better to start with liquids, then soup and crackers, and gradually work up to solid foods.   3) Please notify your doctor immediately if you have any unusual bleeding, trouble breathing, redness and pain at the surgery site, drainage, fever, or pain not relieved by medication.  4) Your post-operative visit with Dr.                                     is: Date:                        Time:    Please call to schedule your post-operative visit.  5) Additional Instructions: Orthopedic discharge instructions: May shower with intact OpSite dressing.  Apply ice frequently to knee or use Polar Care. Take oxycodone as prescribed when needed.  May supplement with ES Tylenol if necessary. Start Eliquis on Wednesday morning (10/07/2019) for 2 weeks, then start aspirin 325 mg daily for 4 weeks. Wear TED hose at all times except may remove for bathing purposes. May weight-bear as tolerated  - use walker or crutches as needed. Follow-up in 10-14 days or as scheduled.

## 2019-10-06 NOTE — Progress Notes (Signed)
Up to [facility] voided 700 plus after bladder check of 804

## 2019-10-06 NOTE — H&P (Signed)
Paper H&P to be scanned into permanent record. H&P reviewed and patient re-examined. No changes. 

## 2019-10-06 NOTE — OR Nursing (Signed)
Talked to patient at length regarding allergy she states her reaction is seizures after taking Codeine ( initially I was under the impression her allergy was to oxycodone) which has occurred twice. I spoke to [place] in pharmacy he stated oxycodone has a seizure risk of less than 1 percent and suggested a trial. Spoke to Dr Roland Rack and he is witting a new script for Oxyodone tramadol script was given back to Dr Roland Rack. Patient is aware of plan of care and verbalized  Understanding.

## 2019-10-06 NOTE — Transfer of Care (Signed)
Immediate Anesthesia Transfer of Care Note  Patient: Maria Donaldson  Procedure(s) Performed: TOTAL HIP ARTHROPLASTY (Right Hip)  Patient Location: PACU  Anesthesia Type:Spinal  Level of Consciousness: awake, alert , oriented and patient cooperative  Airway & Oxygen Therapy: Patient Spontanous Breathing  Post-op Assessment: Report given to RN and Post -op Vital signs reviewed and stable  Post vital signs: Reviewed and stable  Last Vitals:  Vitals Value Taken Time  BP 97/61 10/06/19 0957  Temp 35.9 C 10/06/19 0957  Pulse 57 10/06/19 1003  Resp 11 10/06/19 1003  SpO2 100 % 10/06/19 1003  Vitals shown include unvalidated device data.  Last Pain:  Vitals:   10/06/19 0957  TempSrc:   PainSc: 0-No pain         Complications: No apparent anesthesia complications

## 2019-10-06 NOTE — Op Note (Signed)
10/06/2019  9:58 AM  Patient:   Maria Donaldson  Pre-Op Diagnosis:   Degenerative joint disease, right hip.  Post-Op Diagnosis:   Same.  Procedure:   Right total hip arthroplasty.  Surgeon:   Pascal Lux, MD  Assistant:   Cameron Proud, PA-C  Anesthesia:   Spinal  Findings:   As above.  Complications:   None  EBL:   150 cc  Fluids:   1000 cc crystalloid  UOP:   None  TT:   None  Drains:   None  Closure:   Staples  Implants:   Biomet press-fit system with a #11 standard offset Echo femoral stem, a 50 mm acetabular shell with an E-poly hi-wall liner, and a 36 mm Chrome-Cobalt head with a -6 mm neck.  Brief Clinical Note:   The patient is a 59 year old female with a history of progressively worsening right hip pain. Her symptoms have progressed despite medications, activity modification, etc. Her history and examination are consistent with advanced degenerative joint disease. These findings are confirmed by plain radiographs. She presents at this time for a right total hip replacement.   Procedure:   The patient was brought into the operating room. After adequate spinal anesthesia was obtained, the patient was repositioned in the left lateral decubitus position and secured using a lateral hip positioner. The right hip and lower extremity were prepped with ChloroPrep solution before being draped sterilely. Preoperative antibiotics were administered. A timeout was performed to verify the appropriate surgical site before a standard posterior approach to the hip was made through an approximately 4-5 inch incision. The incision was carried down through the subcutaneous tissues to expose the gluteal fascia and proximal end of the iliotibial band. These structures were split the length of the incision and the Charnley self-retaining hip retractor placed. The bursal tissues were swept posteriorly to expose the short external rotators. The anterior border of the piriformis tendon was  identified and this plane developed down through the capsule to enter the joint. A flap of tissue was elevated off the posterior aspect of the femoral neck and greater trochanter and retracted posteriorly. This flap included the piriformis tendon, the short external rotators, and the posterior capsule. The soft tissues were elevated off the lateral aspect of the ilium and a large Steinmann pin placed bicortically. With the right leg aligned over the left, a drill bit was placed into the greater trochanter parallel to the Steinmann pin and the distance between these two pins measured in order to optimize leg lengths postoperatively. The drill bit was removed and the hip dislocated. The piriformis fossa was debrided of soft tissues before the intramedullary canal was accessed through this point using a triple step reamer. The canal was reamed sequentially beginning with a #7 tapered reamer and progressing to an #11 tapered reamer. This provided excellent circumferential chatter. Using the appropriate guide, a femoral neck cut was made 10-12 mm above the lesser trochanter. The femoral head was removed.  Attention was directed to the acetabular side. The labrum was debrided circumferentially before the ligamentum teres was removed using a large curette. A line was drawn on the drapes corresponding to the native version of the acetabulum. This line was used as a guide while the acetabulum was reamed sequentially beginning with a 41 mm reamer and progressing to a 49 mm reamer. This provided excellent circumferential chatter. The 49 mm trial acetabulum was positioned and found to fit quite well. Therefore, the 50 mm acetabular shell  was selected and impacted into place with care taken to maintain the appropriate version. The trial high wall liner was inserted.  Attention was redirected to the femoral side. A box osteotome was used to establish version before the canal was broached sequentially beginning with a #7  broach and progressing to an #11 broach. This was left in place and several trial reductions performed using both a standard and laterally offset neck options, as well as the -6 mm neck length. The permanent E-polyethylene hi-wall liner was impacted into the acetabular shell and its locking mechanism verified using a quarter-inch osteotome. Next, the #11 standard offset femoral stem was impacted into place with care taken to maintain the appropriate version. A repeat trial reduction was performed using the -6 mm neck length. The -6 mm neck length demonstrated excellent stability both in extension and external rotation as well as with flexion to 90 and internal rotation beyond 70. It also was stable in the position of sleep. In addition, leg lengths appeared to be restored appropriately, both by reassessing the position of the right leg over the left, as well as by measuring the distance between the Steinmann pin and the drill bit. The 36 mm chrome-cobalt head with the -6 mm neck length was impacted onto the stem of the femoral component. The Morse taper locking mechanism was verified using manual distraction before the head was relocated and placed through a range of motion with the findings as described above.  The wound was copiously irrigated with sterile saline solution via the jet lavage system before the peri-incisional and pericapsular tissues were injected with 30 cc of 0.5% Sensorcaine with epinephrine and 20 cc of Exparel diluted out to 60 cc with normal saline to help with postoperative analgesia. The posterior flap was reapproximated to the posterior aspect of the greater trochanter using #2 Tycron interrupted sutures placed through drill holes. Several additional #2 Tycron interrupted sutures were used to reinforce this layer of closure. The iliotibial band was reapproximated using #1 Vicryl interrupted sutures before the gluteal fascia was closed using a running #1 Vicryl suture. At this point, 1 g  of transexemic acid in 10 cc of normal saline was injected into the joint to help reduce postoperative bleeding. The subcutaneous tissues were closed in several layers using 2-0 Vicryl interrupted sutures before the skin was closed using staples. A sterile occlusive dressing was applied to the wound. The patient was then rolled back into the supine position on her hospital bed before being awakened and returned to the recovery room in satisfactory condition after tolerating the procedure well.

## 2019-10-06 NOTE — Progress Notes (Signed)
Moving all legs well   Right side a little weaker then left

## 2019-10-07 LAB — SURGICAL PATHOLOGY

## 2019-10-07 NOTE — Anesthesia Postprocedure Evaluation (Signed)
Anesthesia Post Note  Patient: Maria Donaldson  Procedure(s) Performed: TOTAL HIP ARTHROPLASTY (Right Hip)  Patient location during evaluation: PACU Anesthesia Type: Spinal Level of consciousness: oriented and awake and alert Pain management: pain level controlled Vital Signs Assessment: post-procedure vital signs reviewed and stable Respiratory status: spontaneous breathing, respiratory function stable and patient connected to nasal cannula oxygen Cardiovascular status: blood pressure returned to baseline and stable Postop Assessment: no headache, no backache and no apparent nausea or vomiting Anesthetic complications: no     Last Vitals:  Vitals:   10/06/19 1309 10/06/19 1713  BP: 104/66 103/61  Pulse: 60 60  Resp: 18 18  Temp: 36.6 C 36.6 C  SpO2: 100% 100%    Last Pain:  Vitals:   10/06/19 1713  TempSrc:   PainSc: 3                  Martha Clan

## 2019-11-28 ENCOUNTER — Ambulatory Visit: Payer: BC Managed Care – PPO | Attending: Internal Medicine

## 2019-11-28 DIAGNOSIS — Z23 Encounter for immunization: Secondary | ICD-10-CM | POA: Insufficient documentation

## 2019-11-28 NOTE — Progress Notes (Signed)
   Covid-19 Vaccination Clinic  Name:  Maria Donaldson    MRN: [id number] DOB: 1961-05-31  11/28/2019  [name] was observed post Covid-19 immunization for 15 minutes without incidence. She was provided with Vaccine Information Sheet and instruction to access the V-Safe system.   [name] was instructed to call 911 with any severe reactions post vaccine: Marland Kitchen Difficulty breathing  . Swelling of your face and throat  . A fast heartbeat  . A bad rash all over your body  . Dizziness and weakness    Immunizations Administered    Name Date Dose VIS Date Route   Moderna COVID-19 Vaccine 11/28/2019  4:24 PM 0.5 mL 09/01/2019 Intramuscular   Manufacturer: Moderna   Lot: [id number]   [place]:[id number]

## 2019-12-26 ENCOUNTER — Ambulatory Visit: Payer: BC Managed Care – PPO | Attending: Internal Medicine

## 2019-12-26 DIAGNOSIS — Z23 Encounter for immunization: Secondary | ICD-10-CM

## 2019-12-26 NOTE — Progress Notes (Signed)
   Covid-19 Vaccination Clinic  Name:  Iran Blackner    MRN: [id number] DOB: [birth year]  12/26/2019  Ms. Fedorowicz was observed post Covid-19 immunization for 15 minutes without incident. She was provided with Vaccine Information Sheet and instruction to access the V-Safe system.   Ms. Merryman was instructed to call 911 with any severe reactions post vaccine: Marland Kitchen Difficulty breathing  . Swelling of face and throat  . A fast heartbeat  . A bad rash all over body  . Dizziness and weakness   Immunizations Administered    Name Date Dose VIS Date Route   Moderna COVID-19 Vaccine 12/26/2019  1:22 PM 0.5 mL 09/01/2019 Intramuscular   Manufacturer: Levan Hurst   LotUD:[id number]   [place]:[id number]

## 2021-02-15 ENCOUNTER — Other Ambulatory Visit: Payer: Self-pay

## 2021-02-15 ENCOUNTER — Ambulatory Visit: Payer: BC Managed Care – PPO | Admitting: Dermatology

## 2021-02-15 DIAGNOSIS — C44319 Basal cell carcinoma of skin of other parts of face: Secondary | ICD-10-CM

## 2021-02-15 DIAGNOSIS — L237 Allergic contact dermatitis due to plants, except food: Secondary | ICD-10-CM

## 2021-02-15 DIAGNOSIS — D485 Neoplasm of uncertain behavior of skin: Secondary | ICD-10-CM

## 2021-02-15 DIAGNOSIS — C4491 Basal cell carcinoma of skin, unspecified: Secondary | ICD-10-CM

## 2021-02-15 HISTORY — DX: Basal cell carcinoma of skin, unspecified: C44.91

## 2021-02-15 MED ORDER — CLOBETASOL PROPIONATE 0.05 % EX CREA: TOPICAL_CREAM | CUTANEOUS | 1 refills | Status: DC

## 2021-02-15 NOTE — Patient Instructions (Addendum)
Domeboro's soaks twice a day as needed for poison ivy rash. Recommend Ivy Block or other barrier prior to going outdoors. Ivy soap in shower after possible exposure.   Wound Care Instructions  1. Cleanse wound gently with soap and water once a day then pat dry with clean gauze. Apply a thing coat of Petrolatum (petroleum jelly, "Vaseline") over the wound (unless you have an allergy to this). We recommend that you use a new, sterile tube of Vaseline. Do not pick or remove scabs. Do not remove the yellow or white "healing tissue" from the base of the wound.  2. Cover the wound with fresh, clean, nonstick gauze and secure with paper tape. You may use Band-Aids in place of gauze and tape if the would is small enough, but would recommend trimming much of the tape off as there is often too much. Sometimes Band-Aids can irritate the skin.  3. You should call the office for your biopsy report after 1 week if you have not already been contacted.  4. If you experience any problems, such as abnormal amounts of bleeding, swelling, significant bruising, significant pain, or evidence of infection, please call the office immediately.  5. FOR ADULT SURGERY PATIENTS: If you need something for pain relief you may take 1 extra strength Tylenol (acetaminophen) AND 2 Ibuprofen (200mg  each) together every 4 hours as needed for pain. (do not take these if you are allergic to them or if you have a reason you should not take them.) Typically, you may only need pain medication for 1 to 3 days.      If you have any questions or concerns for your doctor, please call our main line at [phone] and press option 4 to reach your doctor's medical assistant. If no one answers, please leave a voicemail as directed and we will return your call as soon as possible. Messages left after 4 pm will be answered the following business day.   You may also send [place] a message via [place]. We typically respond to MyChart messages within  1-2 business days.  For prescription refills, please ask your pharmacy to contact our office. Our fax number is [phone].  If you have an urgent issue when the clinic is closed that cannot wait until the next business day, you can page your doctor at the number below.    Please note that while we do our best to be available for urgent issues outside of office hours, we are not available 24/7.   If you have an urgent issue and are unable to reach [place], you may choose to seek medical care at your doctor's office, retail clinic, urgent care center, or emergency room.  If you have a medical emergency, please immediately call 911 or go to the emergency department.  Pager Numbers  - Dr. Nehemiah Massed: [phone]  - Dr. Laurence Ferrari: [phone]  - Dr. Nicole Kindred: [phone]  In the event of inclement weather, please call our main line at [phone] for an update on the status of any delays or closures.  Dermatology Medication Tips: Please keep the boxes that topical medications come in in order to help keep track of the instructions about where and how to use these. Pharmacies typically print the medication instructions only on the boxes and not directly on the medication tubes.   If your medication is too expensive, please contact our office at [phone] option 4 or send [place] a message through [place].   We are unable to tell what your co-pay for  medications will be in advance as this is different depending on your insurance coverage. However, we may be able to find a substitute medication at lower cost or fill out paperwork to get insurance to cover a needed medication.   If a prior authorization is required to get your medication covered by your insurance company, please allow [place] 1-2 business days to complete this process.  Drug prices often vary depending on where the prescription is filled and some pharmacies may offer cheaper prices.  The website www.goodrx.com contains coupons for  medications through different pharmacies. The prices here do not account for what the cost may be with help from insurance (it may be cheaper with your insurance), but the website can give you the price if you did not use any insurance.  - You can print the associated coupon and take it with your prescription to the pharmacy.  - You may also stop by our office during regular business hours and pick up a GoodRx coupon card.  - If you need your prescription sent electronically to a different pharmacy, notify our office through [facility] or by phone at [phone] option 4.

## 2021-02-15 NOTE — Progress Notes (Signed)
   New Patient Visit  Subjective  Maria Donaldson is a 60 y.o. female who presents for the following: Pink spot (Forehead present since October. Area has bled and never heals.) and Poison Ivy (Right thigh, lower legs, and left arm that came up over a week ago. Patient has tried several OTC meds for poison ivy, not helping itch.).  Wants to try and avoid oral treatment if possible, since she gets exposed so frequently.  The following portions of the chart were reviewed this encounter and updated as appropriate:       Review of Systems:  No other skin or systemic complaints except as noted in HPI or Assessment and Plan.  Objective  Well appearing patient in no apparent distress; mood and affect are within normal limits.  A focused examination was performed including face, legs, arms. Relevant physical exam findings are noted in the Assessment and Plan.  Objective  Right medial thigh > left thigh, left wrist: Scaly erythematous papules and plaques +/- edema and vesiculation.   Objective  R glabella: 5.43mm pink pearly papule      Assessment & Plan  Allergic contact dermatitis due to plants, except food Right medial thigh > left thigh, left wrist  Discussed oral prednisone vs topical steroid treatment. Patient prefers to use topical.  She will call for Rx if worsens.  Recommend Ivy Block or similar barrier cream prior to going outdoors.  Wear long pants and long sleeves shirts when working in yard.  Careful when removing clothing and shower afterwards using soap to remove any potential oil exposure on skin  Domeboro's soaks/compresses 2x/day until improved followed by topical clobetasol cream  Start clobetasol cream Apply to AA rash BID until improved dsp 60 gm Avoid face, groin, axilla.  May use OTC Benadryl 25 mg PO qhs prn itch.  Topical steroids (such as triamcinolone, fluocinolone, fluocinonide, mometasone, clobetasol, halobetasol, betamethasone, hydrocortisone) can cause  thinning and lightening of the skin if they are used for too long in the same area. Your physician has selected the right strength medicine for your problem and area affected on the body. Please use your medication only as directed by your physician to prevent side effects.     clobetasol cream (TEMOVATE) 0.05 % - Right medial thigh > left thigh, left wrist  Neoplasm of uncertain behavior of skin R glabella  Skin / nail biopsy Type of biopsy: tangential   Informed consent: discussed and consent obtained   Patient was prepped and draped in usual sterile fashion: Area prepped with alcohol. Anesthesia: the lesion was anesthetized in a standard fashion   Anesthetic:  1% lidocaine w/ epinephrine 1-100,000 buffered w/ 8.4% NaHCO3 Instrument used: flexible razor blade   Hemostasis achieved with: pressure, aluminum chloride and electrodesiccation   Outcome: patient tolerated procedure well   Post-procedure details: wound care instructions given   Post-procedure details comment:  Ointment and small bandage applied  Specimen 1 - Surgical pathology Differential Diagnosis: r/o BCC Check Margins: No 5.76mm pink pearly papule  If +, plan MOHs at [facility] in [place].  Return pending biopsy results.  Documentation: I have reviewed the above documentation for accuracy and completeness, and I agree with the above.  Brendolyn Patty MD

## 2021-02-20 ENCOUNTER — Telehealth: Payer: Self-pay

## 2021-02-20 ENCOUNTER — Other Ambulatory Visit: Payer: Self-pay

## 2021-02-20 DIAGNOSIS — C44319 Basal cell carcinoma of skin of other parts of face: Secondary | ICD-10-CM

## 2021-02-20 NOTE — Telephone Encounter (Signed)
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

## 2021-02-20 NOTE — Telephone Encounter (Signed)
Patient advised biopsy of the right glabella was BCC. Will refer for [facility] to the [facility] in [place].

## 2021-11-24 ENCOUNTER — Other Ambulatory Visit: Payer: Self-pay | Admitting: Surgery

## 2021-11-27 ENCOUNTER — Other Ambulatory Visit: Payer: Self-pay | Admitting: Surgery

## 2021-11-27 DIAGNOSIS — M19012 Primary osteoarthritis, left shoulder: Secondary | ICD-10-CM

## 2021-11-27 DIAGNOSIS — M7582 Other shoulder lesions, left shoulder: Secondary | ICD-10-CM

## 2021-12-07 ENCOUNTER — Encounter
Admission: RE | Admit: 2021-12-07 | Discharge: 2021-12-07 | Disposition: A | Discharge status: Home / Self Care | Payer: BC Managed Care – PPO | Source: Ambulatory Visit | Attending: Surgery | Admitting: Surgery

## 2021-12-07 ENCOUNTER — Ambulatory Visit
Admission: RE | Admit: 2021-12-07 | Discharge: 2021-12-07 | Disposition: A | Discharge status: Home / Self Care | Payer: BC Managed Care – PPO | Source: Ambulatory Visit | Attending: Surgery | Admitting: Surgery

## 2021-12-07 ENCOUNTER — Other Ambulatory Visit: Payer: Self-pay

## 2021-12-07 VITALS — BP 105/73 | HR 56 | Resp 14 | Ht 60.0 in | Wt 114.2 lb

## 2021-12-07 DIAGNOSIS — Z01818 Encounter for other preprocedural examination: Secondary | ICD-10-CM | POA: Insufficient documentation

## 2021-12-07 DIAGNOSIS — M19012 Primary osteoarthritis, left shoulder: Secondary | ICD-10-CM

## 2021-12-07 DIAGNOSIS — M7582 Other shoulder lesions, left shoulder: Secondary | ICD-10-CM

## 2021-12-07 HISTORY — DX: Family history of other specified conditions: Z84.89

## 2021-12-07 HISTORY — DX: Unspecified convulsions: R56.9

## 2021-12-07 HISTORY — DX: Attention-deficit hyperactivity disorder, unspecified type: F90.9

## 2021-12-07 LAB — CBC WITH DIFFERENTIAL/PLATELET
Abs Immature Granulocytes: 0.01 10*3/uL (ref 0.00–0.07)
Basophils Absolute: 0 10*3/uL (ref 0.0–0.1)
Basophils Relative: 1 %
Eosinophils Absolute: 0.2 10*3/uL (ref 0.0–0.5)
Eosinophils Relative: 5 %
HCT: 41.4 % (ref 36.0–46.0)
Hemoglobin: 13.8 g/dL (ref 12.0–15.0)
Immature Granulocytes: 0 %
Lymphocytes Relative: 18 %
Lymphs Abs: 1 10*3/uL (ref 0.7–4.0)
MCH: 30.5 pg (ref 26.0–34.0)
MCHC: 33.3 g/dL (ref 30.0–36.0)
MCV: 91.6 fL (ref 80.0–100.0)
Monocytes Absolute: 0.5 10*3/uL (ref 0.1–1.0)
Monocytes Relative: 9 %
Neutro Abs: 3.7 10*3/uL (ref 1.7–7.7)
Neutrophils Relative %: 67 %
Platelets: 226 10*3/uL (ref 150–400)
RBC: 4.52 MIL/uL (ref 3.87–5.11)
RDW: 11.6 % (ref 11.5–15.5)
WBC: 5.4 10*3/uL (ref 4.0–10.5)
nRBC: 0 % (ref 0.0–0.2)

## 2021-12-07 LAB — COMPREHENSIVE METABOLIC PANEL
ALT: 12 U/L (ref 0–44)
AST: 16 U/L (ref 15–41)
Albumin: 3.9 g/dL (ref 3.5–5.0)
Alkaline Phosphatase: 46 U/L (ref 38–126)
Anion gap: 8 (ref 5–15)
BUN: 18 mg/dL (ref 8–23)
CO2: 28 mmol/L (ref 22–32)
Calcium: 9 mg/dL (ref 8.9–10.3)
Chloride: 101 mmol/L (ref 98–111)
Creatinine, Ser: 0.57 mg/dL (ref 0.44–1.00)
GFR, Estimated: >60 mL/min (ref >60)
Glucose, Bld: 78 mg/dL (ref 70–99)
Potassium: 3.9 mmol/L (ref 3.5–5.1)
Sodium: 137 mmol/L (ref 135–145)
Total Bilirubin: 0.6 mg/dL (ref 0.3–1.2)
Total Protein: 7 g/dL (ref 6.5–8.1)

## 2021-12-07 LAB — URINALYSIS, ROUTINE W REFLEX MICROSCOPIC
Bilirubin Urine: NEGATIVE
Glucose, UA: NEGATIVE mg/dL
Hgb urine dipstick: NEGATIVE
Ketones, ur: NEGATIVE mg/dL
Leukocytes,Ua: NEGATIVE
Nitrite: NEGATIVE
Protein, ur: NEGATIVE mg/dL
Specific Gravity, Urine: 1.014 (ref 1.005–1.030)
pH: 8 (ref 5.0–8.0)

## 2021-12-07 LAB — TYPE AND SCREEN
ABO/RH(D): B POS
Antibody Screen: NEGATIVE

## 2021-12-07 LAB — SURGICAL PCR SCREEN
MRSA, PCR: NEGATIVE
Staphylococcus aureus: NEGATIVE

## 2021-12-07 NOTE — Patient Instructions (Addendum)
Your procedure is scheduled on:12-21-21 Thursday ?Report to the Registration Desk on the 1st floor of the [facility].Then proceed to the 2nd floor Surgery Desk in the [facility] ?To find out your arrival time, please call [phone] between 1PM - 3PM on:12-20-21 Wednesday ? ?REMEMBER: ?Instructions that are not followed completely may result in serious medical risk, up to and including death; or upon the discretion of your surgeon and anesthesiologist your surgery may need to be rescheduled. ? ?Do not eat food after midnight the night before surgery.  ?No gum chewing, lozengers or hard candies. ? ?You may however, drink CLEAR liquids up to 2 hours before you are scheduled to arrive for your surgery. Do not drink anything within 2 hours of your scheduled arrival time. ? ?Clear liquids include: ?- water  ?- apple juice without pulp ?- gatorade (not RED colors) ?- black coffee or tea (Do NOT add milk or creamers to the coffee or tea) ?Do NOT drink anything that is not on this list. ? ?In addition, your doctor has ordered for you to drink the provided  ?Ensure Pre-Surgery Clear Carbohydrate Drink  ?Drinking this carbohydrate drink up to two hours before surgery helps to reduce insulin resistance and improve patient outcomes. Please complete drinking 2 hours prior to scheduled arrival time. ? ?Do NOT take any medication the day of surgery ? ?One week prior to surgery: ?Stop Anti-inflammatories (NSAIDS) such as Advil, Aleve, Ibuprofen, Motrin, Naproxen, Naprosyn and Aspirin based products such as Excedrin, Goodys Powder, BC Powder.You may however, take Tylenol if needed for pain up until the day of surgery. ? ?Stop ANY OVER THE COUNTER supplements/vitamins 7 days prior to surgery  ? ?No Alcohol for 24 hours before or after surgery. ? ?No Smoking including e-cigarettes for 24 hours prior to surgery.  ?No chewable tobacco products for at least 6 hours prior to surgery.  ?No nicotine patches on the day of  surgery. ? ?Do not use any "recreational" drugs for at least a week prior to your surgery.  ?Please be advised that the combination of cocaine and anesthesia may have negative outcomes, up to and including death. ?If you test positive for cocaine, your surgery will be cancelled. ? ?On the morning of surgery brush your teeth with toothpaste and water, you may rinse your mouth with mouthwash if you wish. ?Do not swallow any toothpaste or mouthwash. ? ?Use CHG Soap as directed on instruction sheet. ? ?Total Shoulder Arthroplasty:  use Benzolyl Peroxide 5% Gel as directed on instruction sheet ? ?Do not wear jewelry, make-up, hairpins, clips or nail polish. ? ?Do not wear lotions, powders, or perfumes.  ? ?Do not shave body from the neck down 48 hours prior to surgery just in case you cut yourself which could leave a site for infection.  ?Also, freshly shaved skin may become irritated if using the CHG soap. ? ?Contact lenses, hearing aids and dentures may not be worn into surgery. ? ?Do not bring valuables to the hospital. [facility] is not responsible for any missing/lost belongings or valuables.  ? ?Notify your doctor if there is any change in your medical condition (cold, fever, infection). ? ?Wear comfortable clothing (specific to your surgery type) to the hospital. ? ?After surgery, you can help prevent lung complications by doing breathing exercises.  ?Take deep breaths and cough every 1-2 hours. Your doctor may order a device called an Incentive Spirometer to help you take deep breaths. ?When coughing or sneezing, hold a pillow  firmly against your incision with both hands. This is called ?splinting.? Doing this helps protect your incision. It also decreases belly discomfort. ? ?If you are being admitted to the hospital overnight, leave your suitcase in the car. ?After surgery it may be brought to your room. ? ?If you are being discharged the day of surgery, you will not be allowed to drive home. ?You will need a  responsible adult (18 years or older) to drive you home and stay with you that night.  ? ?If you are taking public transportation, you will need to have a responsible adult (18 years or older) with you. ?Please confirm with your physician that it is acceptable to use public transportation.  ? ?Please call the [facility] at [phone] if you have any questions about these instructions. ? ?Surgery Visitation Policy: ? ?Patients undergoing a surgery or procedure may have one family member or support person with them as long as that person is not COVID-19 positive or experiencing its symptoms.  ?That person may remain in the waiting area during the procedure and may rotate out with other people. ? ?Inpatient Visitation:   ? ?Visiting hours are 7 a.m. to 8 p.m. ?Up to two visitors ages 16+ are allowed at one time in a patient room. The visitors may rotate out with other people during the day. Visitors must check out when they leave, or other visitors will not be allowed. One designated support person may remain overnight. ?The visitor must pass COVID-19 screenings, use hand sanitizer when entering and exiting the patient?s room and wear a mask at all times, including in the patient?s room. ?Patients must also wear a mask when staff or their visitor are in the room. ?Masking is required regardless of vaccination status.  ?

## 2021-12-19 ENCOUNTER — Other Ambulatory Visit: Admission: RE | Admit: 2021-12-19 | Payer: BC Managed Care – PPO | Source: Ambulatory Visit

## 2021-12-21 ENCOUNTER — Ambulatory Visit: Payer: BC Managed Care – PPO | Admitting: Urgent Care

## 2021-12-21 ENCOUNTER — Encounter: Payer: Self-pay | Admitting: Surgery

## 2021-12-21 ENCOUNTER — Other Ambulatory Visit: Payer: Self-pay

## 2021-12-21 ENCOUNTER — Ambulatory Visit: Payer: BC Managed Care – PPO

## 2021-12-21 ENCOUNTER — Ambulatory Visit
Admission: RE | Admit: 2021-12-21 | Discharge: 2021-12-21 | Disposition: A | Discharge status: Home / Self Care | Payer: BC Managed Care – PPO | Attending: Surgery | Admitting: Surgery

## 2021-12-21 ENCOUNTER — Encounter
Admission: RE | Disposition: A | Discharge status: Home / Self Care | Payer: Self-pay | Source: Home / Self Care | Attending: Surgery

## 2021-12-21 DIAGNOSIS — M19012 Primary osteoarthritis, left shoulder: Secondary | ICD-10-CM | POA: Diagnosis not present

## 2021-12-21 DIAGNOSIS — F909 Attention-deficit hyperactivity disorder, unspecified type: Secondary | ICD-10-CM | POA: Insufficient documentation

## 2021-12-21 HISTORY — PX: TOTAL SHOULDER ARTHROPLASTY: SHX126

## 2021-12-21 SURGERY — ARTHROPLASTY, SHOULDER, TOTAL
Anesthesia: Regional | Site: Shoulder | Laterality: Left

## 2021-12-21 MED ORDER — METOCLOPRAMIDE HCL 10 MG PO TABS: 5.0000 mg | ORAL_TABLET | Freq: Three times a day (TID) | ORAL | Status: DC | PRN

## 2021-12-21 MED ORDER — FAMOTIDINE 20 MG PO TABS: 20.0000 mg | ORAL_TABLET | Freq: Once | ORAL | Status: DC

## 2021-12-21 MED ORDER — BUPIVACAINE LIPOSOME 1.3 % IJ SUSP
INTRAMUSCULAR | Status: AC
  Filled 2021-12-21: qty 10

## 2021-12-21 MED ORDER — KETOROLAC TROMETHAMINE 30 MG/ML IJ SOLN
INTRAMUSCULAR | Status: DC | PRN
Start: 2021-12-21 — End: 2021-12-21
  Administered 2021-12-21: 15 mg via INTRAVENOUS

## 2021-12-21 MED ORDER — TRANEXAMIC ACID 1000 MG/10ML IV SOLN
INTRAVENOUS | Status: DC | PRN
  Administered 2021-12-21: 1000 mg via TOPICAL

## 2021-12-21 MED ORDER — SUGAMMADEX SODIUM 200 MG/2ML IV SOLN
INTRAVENOUS | Status: DC | PRN
  Administered 2021-12-21: 200 mg via INTRAVENOUS

## 2021-12-21 MED ORDER — BUPIVACAINE-EPINEPHRINE (PF) 0.5% -1:200000 IJ SOLN
INTRAMUSCULAR | Status: AC
  Filled 2021-12-21: qty 30

## 2021-12-21 MED ORDER — CEFAZOLIN SODIUM-DEXTROSE 2-4 GM/100ML-% IV SOLN
2.0000 g | INTRAVENOUS | Status: AC
  Administered 2021-12-21: 2 g via INTRAVENOUS

## 2021-12-21 MED ORDER — ONDANSETRON HCL 4 MG/2ML IJ SOLN
INTRAMUSCULAR | Status: AC
  Filled 2021-12-21: qty 2

## 2021-12-21 MED ORDER — PROPOFOL 10 MG/ML IV BOLUS
INTRAVENOUS | Status: DC | PRN
  Administered 2021-12-21: 100 mg via INTRAVENOUS

## 2021-12-21 MED ORDER — PHENYLEPHRINE HCL (PRESSORS) 10 MG/ML IV SOLN
INTRAVENOUS | Status: DC | PRN
  Administered 2021-12-21: 40 ug via INTRAVENOUS
  Administered 2021-12-21 (×3): 80 ug via INTRAVENOUS
  Administered 2021-12-21: 40 ug via INTRAVENOUS

## 2021-12-21 MED ORDER — BUPIVACAINE HCL (PF) 0.25 % IJ SOLN
INTRAMUSCULAR | Status: DC | PRN
  Administered 2021-12-21: 20 mL

## 2021-12-21 MED ORDER — APREPITANT 40 MG PO CAPS
ORAL_CAPSULE | ORAL | Status: AC
  Administered 2021-12-21: 40 mg via ORAL
  Filled 2021-12-21: qty 1

## 2021-12-21 MED ORDER — SODIUM CHLORIDE FLUSH 0.9 % IV SOLN
INTRAVENOUS | Status: AC
  Filled 2021-12-21: qty 50

## 2021-12-21 MED ORDER — CEFAZOLIN SODIUM-DEXTROSE 2-4 GM/100ML-% IV SOLN
INTRAVENOUS | Status: AC
Start: 2021-12-21 — End: 2021-12-21
  Administered 2021-12-21: 2 g via INTRAVENOUS
  Filled 2021-12-21: qty 100

## 2021-12-21 MED ORDER — BUPIVACAINE LIPOSOME 1.3 % IJ SUSP
INTRAMUSCULAR | Status: AC
  Filled 2021-12-21: qty 20

## 2021-12-21 MED ORDER — PHENYLEPHRINE HCL (PRESSORS) 10 MG/ML IV SOLN
INTRAVENOUS | Status: AC
  Filled 2021-12-21: qty 1

## 2021-12-21 MED ORDER — ONDANSETRON 4 MG PO TBDP: 4.0000 mg | ORAL_TABLET | Freq: Four times a day (QID) | ORAL | 0 refills | Status: DC | PRN

## 2021-12-21 MED ORDER — MIDAZOLAM HCL 2 MG/2ML IJ SOLN
INTRAMUSCULAR | Status: AC
Start: 2021-12-21 — End: 2021-12-21
  Administered 2021-12-21: 2 mg via INTRAVENOUS
  Filled 2021-12-21: qty 2

## 2021-12-21 MED ORDER — ONDANSETRON HCL 4 MG PO TABS
4.0000 mg | ORAL_TABLET | Freq: Four times a day (QID) | ORAL | Status: DC | PRN
Start: 2021-12-21 — End: 2021-12-21

## 2021-12-21 MED ORDER — STERILE WATER FOR IRRIGATION IR SOLN
Status: DC | PRN
  Administered 2021-12-21: 1000 mL

## 2021-12-21 MED ORDER — PHENYLEPHRINE 40 MCG/ML (10ML) SYRINGE FOR IV PUSH (FOR BLOOD PRESSURE SUPPORT)
PREFILLED_SYRINGE | INTRAVENOUS | Status: AC
  Filled 2021-12-21: qty 10

## 2021-12-21 MED ORDER — KETOROLAC TROMETHAMINE 15 MG/ML IJ SOLN: 15.0000 mg | Freq: Once | INTRAMUSCULAR | Status: AC

## 2021-12-21 MED ORDER — METOCLOPRAMIDE HCL 5 MG/ML IJ SOLN: 5.0000 mg | Freq: Three times a day (TID) | INTRAMUSCULAR | Status: DC | PRN

## 2021-12-21 MED ORDER — BUPIVACAINE HCL (PF) 0.5 % IJ SOLN
INTRAMUSCULAR | Status: AC
  Filled 2021-12-21: qty 10

## 2021-12-21 MED ORDER — SCOPOLAMINE 1 MG/3DAYS TD PT72: 1.0000 | MEDICATED_PATCH | TRANSDERMAL | Status: DC

## 2021-12-21 MED ORDER — EPHEDRINE SULFATE (PRESSORS) 50 MG/ML IJ SOLN
INTRAMUSCULAR | Status: DC | PRN
  Administered 2021-12-21 (×5): 5 mg via INTRAVENOUS

## 2021-12-21 MED ORDER — LACTATED RINGERS IV SOLN: INTRAVENOUS | Status: DC

## 2021-12-21 MED ORDER — SODIUM CHLORIDE 0.9 % IV SOLN
INTRAVENOUS | Status: DC | PRN
  Administered 2021-12-21: 100 mL via TOPICAL

## 2021-12-21 MED ORDER — FENTANYL CITRATE (PF) 100 MCG/2ML IJ SOLN
INTRAMUSCULAR | Status: AC
  Filled 2021-12-21: qty 2

## 2021-12-21 MED ORDER — ACETAMINOPHEN 10 MG/ML IV SOLN
INTRAVENOUS | Status: AC
  Filled 2021-12-21: qty 100

## 2021-12-21 MED ORDER — SODIUM CHLORIDE 0.9 % IV SOLN: INTRAVENOUS | Status: DC

## 2021-12-21 MED ORDER — ORAL CARE MOUTH RINSE: 15.0000 mL | Freq: Once | OROMUCOSAL | Status: AC

## 2021-12-21 MED ORDER — BUPIVACAINE LIPOSOME 1.3 % IJ SUSP
INTRAMUSCULAR | Status: DC | PRN
  Administered 2021-12-21: 10 mL

## 2021-12-21 MED ORDER — BUPIVACAINE-EPINEPHRINE (PF) 0.5% -1:200000 IJ SOLN
INTRAMUSCULAR | Status: DC | PRN
  Administered 2021-12-21: 40 mL

## 2021-12-21 MED ORDER — EPHEDRINE 5 MG/ML INJ
INTRAVENOUS | Status: AC
  Filled 2021-12-21: qty 5

## 2021-12-21 MED ORDER — KETOROLAC TROMETHAMINE 15 MG/ML IJ SOLN: 7.5000 mg | Freq: Four times a day (QID) | INTRAMUSCULAR | Status: DC

## 2021-12-21 MED ORDER — KETOROLAC TROMETHAMINE 30 MG/ML IJ SOLN
INTRAMUSCULAR | Status: AC
  Filled 2021-12-21: qty 1

## 2021-12-21 MED ORDER — TRAMADOL HCL 50 MG PO TABS: 50.0000 mg | ORAL_TABLET | Freq: Four times a day (QID) | ORAL | 0 refills | Status: AC | PRN

## 2021-12-21 MED ORDER — ONDANSETRON HCL 4 MG/2ML IJ SOLN: 4.0000 mg | Freq: Four times a day (QID) | INTRAMUSCULAR | Status: DC | PRN

## 2021-12-21 MED ORDER — ONDANSETRON HCL 4 MG/2ML IJ SOLN: 4.0000 mg | Freq: Once | INTRAMUSCULAR | Status: DC | PRN

## 2021-12-21 MED ORDER — CHLORHEXIDINE GLUCONATE 0.12 % MT SOLN: 15.0000 mL | Freq: Once | OROMUCOSAL | Status: AC

## 2021-12-21 MED ORDER — ACETAMINOPHEN 10 MG/ML IV SOLN
INTRAVENOUS | Status: DC | PRN
  Administered 2021-12-21: 1000 mg via INTRAVENOUS

## 2021-12-21 MED ORDER — ACETAMINOPHEN 500 MG PO TABS: 500.0000 mg | ORAL_TABLET | Freq: Four times a day (QID) | ORAL | Status: DC

## 2021-12-21 MED ORDER — CHLORHEXIDINE GLUCONATE 0.12 % MT SOLN
OROMUCOSAL | Status: AC
  Administered 2021-12-21: 15 mL via OROMUCOSAL
  Filled 2021-12-21: qty 15

## 2021-12-21 MED ORDER — TRAMADOL HCL 50 MG PO TABS: 50.0000 mg | ORAL_TABLET | Freq: Four times a day (QID) | ORAL | Status: DC | PRN

## 2021-12-21 MED ORDER — OXYCODONE HCL 5 MG/5ML PO SOLN: 5.0000 mg | Freq: Once | ORAL | Status: DC | PRN

## 2021-12-21 MED ORDER — ROCURONIUM BROMIDE 10 MG/ML (PF) SYRINGE
PREFILLED_SYRINGE | INTRAVENOUS | Status: AC
Start: 2021-12-21 — End: ?
  Filled 2021-12-21: qty 10

## 2021-12-21 MED ORDER — DEXAMETHASONE SODIUM PHOSPHATE 10 MG/ML IJ SOLN
INTRAMUSCULAR | Status: DC | PRN
Start: 2021-12-21 — End: 2021-12-21
  Administered 2021-12-21: 8 mg via INTRAVENOUS

## 2021-12-21 MED ORDER — ROCURONIUM BROMIDE 100 MG/10ML IV SOLN
INTRAVENOUS | Status: DC | PRN
  Administered 2021-12-21: 40 mg via INTRAVENOUS
  Administered 2021-12-21: 10 mg via INTRAVENOUS
  Administered 2021-12-21: 20 mg via INTRAVENOUS

## 2021-12-21 MED ORDER — SUCCINYLCHOLINE CHLORIDE 200 MG/10ML IV SOSY
PREFILLED_SYRINGE | INTRAVENOUS | Status: AC
  Filled 2021-12-21: qty 10

## 2021-12-21 MED ORDER — APREPITANT 40 MG PO CAPS: 40.0000 mg | ORAL_CAPSULE | Freq: Once | ORAL | Status: AC

## 2021-12-21 MED ORDER — LIDOCAINE HCL (CARDIAC) PF 100 MG/5ML IV SOSY
PREFILLED_SYRINGE | INTRAVENOUS | Status: DC | PRN
  Administered 2021-12-21: 60 mg via INTRAVENOUS

## 2021-12-21 MED ORDER — SODIUM CHLORIDE 0.9 % IR SOLN
Status: DC | PRN
Start: 2021-12-21 — End: 2021-12-21
  Administered 2021-12-21: 3000 mL

## 2021-12-21 MED ORDER — ACETAMINOPHEN 10 MG/ML IV SOLN: 1000.0000 mg | Freq: Once | INTRAVENOUS | Status: DC | PRN

## 2021-12-21 MED ORDER — 0.9 % SODIUM CHLORIDE (POUR BTL) OPTIME
TOPICAL | Status: DC | PRN
Start: 2021-12-21 — End: 2021-12-21
  Administered 2021-12-21: 500 mL

## 2021-12-21 MED ORDER — TRANEXAMIC ACID 1000 MG/10ML IV SOLN
INTRAVENOUS | Status: AC
  Filled 2021-12-21: qty 10

## 2021-12-21 MED ORDER — FENTANYL CITRATE PF 50 MCG/ML IJ SOSY
PREFILLED_SYRINGE | INTRAMUSCULAR | Status: AC
  Administered 2021-12-21: 50 ug via INTRAVENOUS
  Filled 2021-12-21: qty 1

## 2021-12-21 MED ORDER — SCOPOLAMINE 1 MG/3DAYS TD PT72
MEDICATED_PATCH | TRANSDERMAL | Status: AC
  Administered 2021-12-21: 1.5 mg via TRANSDERMAL
  Filled 2021-12-21: qty 1

## 2021-12-21 MED ORDER — DEXAMETHASONE SODIUM PHOSPHATE 10 MG/ML IJ SOLN
INTRAMUSCULAR | Status: AC
  Filled 2021-12-21: qty 1

## 2021-12-21 MED ORDER — DEXAMETHASONE SODIUM PHOSPHATE 10 MG/ML IJ SOLN
INTRAMUSCULAR | Status: AC
  Filled 2021-12-21: qty 2

## 2021-12-21 MED ORDER — GLYCOPYRROLATE 0.2 MG/ML IJ SOLN
INTRAMUSCULAR | Status: DC | PRN
  Administered 2021-12-21: .2 mg via INTRAVENOUS

## 2021-12-21 MED ORDER — FENTANYL CITRATE (PF) 100 MCG/2ML IJ SOLN
INTRAMUSCULAR | Status: DC | PRN
  Administered 2021-12-21: 50 ug via INTRAVENOUS

## 2021-12-21 MED ORDER — CEFAZOLIN SODIUM-DEXTROSE 2-4 GM/100ML-% IV SOLN
INTRAVENOUS | Status: AC
  Filled 2021-12-21: qty 100

## 2021-12-21 MED ORDER — FENTANYL CITRATE (PF) 100 MCG/2ML IJ SOLN: 25.0000 ug | INTRAMUSCULAR | Status: DC | PRN

## 2021-12-21 MED ORDER — MIDAZOLAM HCL 2 MG/2ML IJ SOLN
2.0000 mg | Freq: Once | INTRAMUSCULAR | Status: AC
Start: 2021-12-21 — End: 2021-12-21

## 2021-12-21 MED ORDER — GLYCOPYRROLATE 0.2 MG/ML IJ SOLN
INTRAMUSCULAR | Status: AC
  Filled 2021-12-21: qty 1

## 2021-12-21 MED ORDER — FENTANYL CITRATE PF 50 MCG/ML IJ SOSY: 50.0000 ug | PREFILLED_SYRINGE | Freq: Once | INTRAMUSCULAR | Status: AC

## 2021-12-21 MED ORDER — CEFAZOLIN SODIUM-DEXTROSE 2-4 GM/100ML-% IV SOLN: 2.0000 g | Freq: Four times a day (QID) | INTRAVENOUS | Status: DC

## 2021-12-21 MED ORDER — PHENYLEPHRINE HCL-NACL 20-0.9 MG/250ML-% IV SOLN
INTRAVENOUS | Status: DC | PRN
  Administered 2021-12-21: 20 ug/min via INTRAVENOUS

## 2021-12-21 MED ORDER — OXYCODONE HCL 5 MG PO TABS: 5.0000 mg | ORAL_TABLET | Freq: Once | ORAL | Status: DC | PRN

## 2021-12-21 MED ORDER — LIDOCAINE HCL (PF) 2 % IJ SOLN
INTRAMUSCULAR | Status: AC
Start: 2021-12-21 — End: ?
  Filled 2021-12-21: qty 5

## 2021-12-21 SURGICAL SUPPLY — 63 items
BAG DECANTER FOR FLEXI CONT (MISCELLANEOUS) ×2 IMPLANT
BLADE SAGITTAL WIDE XTHICK NO (BLADE) ×1 IMPLANT
BOWL CEMENT MIX W/ADAPTER (MISCELLANEOUS) ×1 IMPLANT
BUR 4X45 EGG (BURR) ×1 IMPLANT
CEMENT BONE R 1X40 (Cement) ×2 IMPLANT
CHLORAPREP W/TINT 26 (MISCELLANEOUS) ×2 IMPLANT
COMPONENT NUMERAL SHLD 50X46 (Shoulder) IMPLANT
COOLER POLAR GLACIER W/PUMP (MISCELLANEOUS) ×2 IMPLANT
COVER BACK TABLE REUSABLE LG (DRAPES) ×2 IMPLANT
DRAPE 3/4 80X56 (DRAPES) ×4 IMPLANT
DRAPE INCISE IOBAN 66X45 STRL (DRAPES) ×4 IMPLANT
DRSG OPSITE POSTOP 4X8 (GAUZE/BANDAGES/DRESSINGS) ×2 IMPLANT
ELECT BLADE 6.5 EXT (BLADE) ×2 IMPLANT
ELECT CAUTERY BLADE 6.4 (BLADE) ×2 IMPLANT
ELECT REM PT RETURN 9FT ADLT (ELECTROSURGICAL) ×2
ELECTRODE REM PT RTRN 9FT ADLT (ELECTROSURGICAL) ×1 IMPLANT
GAUZE PACK 2X3YD (PACKING) ×2 IMPLANT
GLENOID HEAD 58-55 ART 19X20 (Shoulder) ×1 IMPLANT
GLOVE SRG 8 PF TXTR STRL LF DI (GLOVE) ×1 IMPLANT
GLOVE SURG ENC MOIS LTX SZ7.5 (GLOVE) ×8 IMPLANT
GLOVE SURG ENC MOIS LTX SZ8 (GLOVE) ×8 IMPLANT
GLOVE SURG UNDER LTX SZ8 (GLOVE) ×2 IMPLANT
GLOVE SURG UNDER POLY LF SZ8 (GLOVE) ×1
GOWN STRL REUS W/ TWL LRG LVL3 (GOWN DISPOSABLE) ×1 IMPLANT
GOWN STRL REUS W/ TWL XL LVL3 (GOWN DISPOSABLE) ×1 IMPLANT
GOWN STRL REUS W/TWL LRG LVL3 (GOWN DISPOSABLE) ×1
GOWN STRL REUS W/TWL XL LVL3 (GOWN DISPOSABLE) ×1
HUMERAL COMP SHLD 50X46 (Shoulder) ×2 IMPLANT
IV NS 100ML SINGLE PACK (IV SOLUTION) ×2 IMPLANT
IV NS IRRIG 3000ML ARTHROMATIC (IV SOLUTION) ×2 IMPLANT
KIT INST INFERIOR GLENOID SHLD (INSTRUMENTS) ×1 IMPLANT
KIT STABILIZATION SHOULDER (MISCELLANEOUS) ×2 IMPLANT
MANIFOLD NEPTUNE II (INSTRUMENTS) ×2 IMPLANT
MASK FACE SPIDER DISP (MASK) ×2 IMPLANT
MAT ABSORB  FLUID 56X50 GRAY (MISCELLANEOUS) ×1
MAT ABSORB FLUID 56X50 GRAY (MISCELLANEOUS) ×1 IMPLANT
NDL SAFETY ECLIPSE 18X1.5 (NEEDLE) ×1 IMPLANT
NDL SPNL 20GX3.5 QUINCKE YW (NEEDLE) ×1 IMPLANT
NEEDLE HYPO 18GX1.5 SHARP (NEEDLE) ×1
NEEDLE SPNL 20GX3.5 QUINCKE YW (NEEDLE) ×2 IMPLANT
NS IRRIG 500ML POUR BTL (IV SOLUTION) ×2 IMPLANT
PACK ARTHROSCOPY SHOULDER (MISCELLANEOUS) ×2 IMPLANT
PAD WRAPON POLAR SHDR UNIV (MISCELLANEOUS) ×1 IMPLANT
POST TAPER SHLD 12X32 (Post) ×1 IMPLANT
PULSAVAC PLUS IRRIG FAN TIP (DISPOSABLE) ×2
SLING ULTRA II M (MISCELLANEOUS) ×1 IMPLANT
SPONGE T-LAP 18X18 ~~LOC~~+RFID (SPONGE) ×4 IMPLANT
STAPLER SKIN PROX 35W (STAPLE) ×2 IMPLANT
STRAP SAFETY 5IN WIDE (MISCELLANEOUS) ×2 IMPLANT
SUT ETHIBOND 0 MO6 C/R (SUTURE) ×2 IMPLANT
SUT FIBERWIRE #2 38 BLUE 1/2 (SUTURE) ×8
SUT VIC AB 0 CT1 36 (SUTURE) ×3 IMPLANT
SUT VIC AB 2-0 CT1 27 (SUTURE) ×2
SUT VIC AB 2-0 CT1 TAPERPNT 27 (SUTURE) ×2 IMPLANT
SUTURE FIBERWR #2 38 BLUE 1/2 (SUTURE) ×2 IMPLANT
SYR 10ML LL (SYRINGE) ×2 IMPLANT
SYR 30ML LL (SYRINGE) ×4 IMPLANT
SYR TOOMEY IRRIG 70ML (MISCELLANEOUS) ×2
SYRINGE TOOMEY IRRIG 70ML (MISCELLANEOUS) ×1 IMPLANT
TAPE TRANSPORE STRL 2 31045 (GAUZE/BANDAGES/DRESSINGS) ×2 IMPLANT
TIP FAN IRRIG PULSAVAC PLUS (DISPOSABLE) ×1 IMPLANT
WATER STERILE IRR 500ML POUR (IV SOLUTION) ×2 IMPLANT
WRAPON POLAR PAD SHDR UNIV (MISCELLANEOUS) ×2

## 2021-12-21 NOTE — Anesthesia Procedure Notes (Signed)
Anesthesia Regional Block: Interscalene brachial plexus block  ? ?Pre-Anesthetic Checklist: , timeout performed,  Correct Patient, Correct Site, Correct Laterality,  Correct Procedure,, site marked,  Risks and benefits discussed,  Surgical consent,  Pre-op evaluation,  At surgeon's request and post-op pain management ? ?Laterality: Left ? ?Prep: chloraprep     ?  ?Needles:  ?Injection technique: Single-shot ? ?Needle Type: Echogenic Needle   ? ? ? ? ? ? ? ?Additional Needles: ? ? ?Procedures:,,,, ultrasound used (permanent image in chart),,   ?Motor weakness within 20 minutes.  ?Narrative:  ?Start time: 12/21/2021 9:17 AM ?End time: 12/21/2021 9:19 AM ?Injection made incrementally with aspirations every 5 mL. ? ?Performed by: Personally  ?Anesthesiologist: Darrin Nipper, MD ? ?Additional Notes: ?Functioning IV was confirmed and monitors applied.  Sterile prep and drape, hand hygiene and sterile gloves were used. Ultrasound guidance: relevant anatomy identified, needle position confirmed, local anesthetic spread visualized around nerve(s), vascular puncture avoided.  Image saved to electronic medical record.  Negative aspiration prior to incremental administration of local anesthetic for total 10 ml Exparel and 20 ml bupivacaine 0.25% given in interscalene distribution. The patient tolerated the procedure well. Vital signs and moderate sedation medications recorded in RN notes.  ? ? ? ?

## 2021-12-21 NOTE — Anesthesia Procedure Notes (Signed)
Procedure Name: Intubation ?Date/Time: 12/21/2021 10:36 AM ?Performed by: Hedda Slade, CRNA ?Pre-anesthesia Checklist: Patient identified, Patient being monitored, Timeout performed, Emergency Drugs available and Suction available ?Patient Re-evaluated:Patient Re-evaluated prior to induction ?Oxygen Delivery Method: Circle system utilized ?Preoxygenation: Pre-oxygenation with 100% oxygen ?Induction Type: IV induction ?Ventilation: Mask ventilation without difficulty ?Laryngoscope Size: 3 and McGraph ?Grade View: Grade I ?Tube type: Oral ?Tube size: 7.0 mm ?Number of attempts: 1 ?Airway Equipment and Method: Stylet ?Placement Confirmation: ETT inserted through vocal cords under direct vision, positive ETCO2 and breath sounds checked- equal and bilateral ?Secured at: 19 cm ?Tube secured with: Tape ?Dental Injury: Teeth and Oropharynx as per pre-operative assessment  ? ? ? ? ?

## 2021-12-21 NOTE — Anesthesia Postprocedure Evaluation (Signed)
Anesthesia Post Note ? ?Patient: Maria Donaldson ? ?Procedure(s) Performed: TOTAL SHOULDER ARTHROPLASTY (Left: Shoulder) ? ?Patient location during evaluation: PACU ?Anesthesia Type: General ?Level of consciousness: awake and alert, oriented and patient cooperative ?Pain management: pain level controlled ?Vital Signs Assessment: post-procedure vital signs reviewed and stable ?Respiratory status: spontaneous breathing, nonlabored ventilation and respiratory function stable ?Cardiovascular status: blood pressure returned to baseline and stable ?Postop Assessment: adequate PO intake ?Anesthetic complications: no ? ? ?No notable events documented. ? ? ?Last Vitals:  ?Vitals:  ? 12/21/21 1430 12/21/21 1435  ?BP: 103/62   ?Pulse: 61 (!) 58  ?Resp: 14 12  ?Temp:    ?SpO2: 95% 95%  ?  ?Last Pain:  ?Vitals:  ? 12/21/21 1430  ?PainSc: 0-No pain  ? ? ?  ?  ?  ?  ?  ?  ? ?Darrin Nipper ? ? ? ? ?

## 2021-12-21 NOTE — H&P (Signed)
History of Present Illness: ?Maria Donaldson is a 61 y.o. female who presents today for her surgical history and physical for upcoming left total shoulder arthroplasty. Surgery scheduled with Dr. Roland Rack on 12/21/2021. Since the patient was last evaluated she denies any changes in her medical history. She denies any personal history of heart attack, stroke, asthma or COPD. No personal history of blood clots. The patient reports continued aching and throbbing pain in left greater than right shoulder at this time. She did recently undergo a MRI scan of the left shoulder. She denies any falls or trauma affecting left shoulder since her last evaluation. ? ?Past Medical History: ? Osteoarthritis (followed by Dr. Precious Reel)  ? ?Past Surgical History: ? Right total hip arthroplasty Right 10/06/2019 (Dr. Roland Rack)  ? cysts removial (Cysts removed from fingers & left hand)  ? finger surgery  ? ?Past Family History: ? Arthritis Mother  ? Cancer Father  ? ?Medications: ? dextroamphetamine-amphetamine (ADDERALL) 20 mg tablet Take 20 mg by mouth 2 (two) times daily as needed  ? ?Allergies: ? Codeine Nausea And Vomiting, Rash and Seizures  ? ?Review of Systems:  ?A comprehensive 14 point ROS was performed, reviewed by me today, and the pertinent orthopaedic findings are documented in the HPI. ? ?Physical Exam: ?BP 120/72  Ht 152.4 cm (5')  BMI 22.23 kg/m?  ?General/Constitutional: The patient appears to be well-nourished, well-developed, and in no acute distress. ?Neuro/Psych: Normal mood and affect, oriented to person, place and time. ?Eyes: Non-icteric. Pupils are equal, round, and reactive to light, and exhibit synchronous movement. ?ENT: Unremarkable. ?Lymphatic: No palpable adenopathy. ?Respiratory: Lungs clear to auscultation, Normal chest excursion, No wheezes and Non-labored breathing ?Cardiovascular: Regular rate and rhythm. No murmurs. and No edema, swelling or tenderness, except as noted in detailed  exam. ?Integumentary: No impressive skin lesions present, except as noted in detailed exam. ?Musculoskeletal: Unremarkable, except as noted in detailed exam. ? ?Left shoulder exam: ?SKIN: Normal ?SWELLING: None ?WARMTH: None ?LYMPH NODES: No adenopathy palpable ?CREPITUS: None ?TENDERNESS: Minimal tenderness over anterolateral acromion ?ROM (active):   ?   Forward flexion: 130 degrees ?   Abduction: 125 degrees ?   Internal rotation: L3 ?ROM (passive):   ?   Forward flexion: 140 degrees ?   Abduction: 130 degrees    ?   ER/IR at 90 abd: 70 degrees/45 degrees ?  ?She experiences moderate pain with forward flexion and abduction, and mild pain with all other motions. ?  ?STRENGTH:   Forward flexion: 4+/5 ?                        Abduction: 4-4+/5 ?                        External rotation: 4+/5 ?                        Internal rotation: 4+/5 ?                        Pain with RC testing: Minimal discomfort with resisted abduction ?  ?STABILITY: Normal ?  ?SPECIAL TESTS:       Luan Pulling' test: Mildly positive ?                                    Speed's test:  Negative ?                                    Capsulitis - pain w/ passive ER: No ?                                    Crossed arm test: Minimally positive ?                                    Crank: Not evaluated ?                                    Anterior apprehension: Negative ?                                    Posterior apprehension: Not evaluated  ?  ?She remains neurovascularly intact to the left upper extremity and hand. ? ?MRI OF THE LEFT SHOULDER WITHOUT CONTRAST:  ?1. Moderate anterior greater than posterior supraspinatus tendinosis  ?with mild articular sided degenerative fraying.  ?2. Minimal supraspinatus muscle atrophy.  ?3. Mild-to-moderate degenerative changes of the acromioclavicular  ?joint.  ?4. Severe glenohumeral osteoarthritis.  ? ?Impression: ?Primary osteoarthritis of left shoulder. ?Rotator cuff tendinitis, left. ? ?Plan:  ?1. Treatment  options were discussed today with the patient. ?2. The patient is scheduled for a left total shoulder arthroplasty with Dr. Roland Rack on 12/21/2021. ?3. The patient was instructed on the risk and benefits of surgery and wishes to proceed at this time. ?4. This document will serve as a surgical history and physical for the patient. ?5. She was offered and received a left shoulder slingshot shoulder sling which she will be required to wear after surgery. ?6. The patient will follow-up per standard postop protocol. They can call the clinic they have any questions, new symptoms develop or symptoms worsen. ? ?The procedure was discussed with the patient, as were the potential risks (including bleeding, infection, nerve and/or blood vessel injury, persistent or recurrent pain, failure of the rotator cuff, revision to a reverse total shoulder replacement, need for further surgery, blood clots, strokes, heart attacks and/or arhythmias, pneumonia, etc.) and benefits. The patient states her understanding and wishes to proceed. ? ? ?H&P reviewed and patient re-examined. No changes. ?

## 2021-12-21 NOTE — Anesthesia Preprocedure Evaluation (Addendum)
Anesthesia Evaluation  ?Patient identified by MRN, date of birth, ID band ?Patient awake ? ? ? ?Reviewed: ?Allergy & Precautions, NPO status , Patient's Chart, lab work & pertinent test results ? ?History of Anesthesia Complications ?(+) PONV and history of anesthetic complications ? ?Airway ?Mallampati: I ? ? ?Neck ROM: Full ? ? ? Dental ? ? ?Braces :   ?Pulmonary ?neg pulmonary ROS,  ?  ?Pulmonary exam normal ?breath sounds clear to auscultation ? ? ? ? ? ? Cardiovascular ?Exercise Tolerance: Good ?negative cardio ROS ?Normal cardiovascular exam ?Rhythm:Regular Rate:Normal ? ?ECG 12/07/21: Sinus bradycardia ?Otherwise normal ECG ?When compared with ECG of 02-Sep-2019 10:47, ?No significant change was found ?  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 GI/Hepatic ?negative GI ROS,   ?Endo/Other  ?negative endocrine ROS ? Renal/GU ?negative Renal ROS  ? ?  ?Musculoskeletal ? ?(+) Arthritis ,  ? Abdominal ?  ?Peds ? Hematology ?negative hematology ROS ?(+)   ?Anesthesia Other Findings ? ? Reproductive/Obstetrics ? ?  ? ? ? ? ? ? ? ? ? ? ? ? ? ?  ?  ? ? ? ? ? ? ? ?Anesthesia Physical ?Anesthesia Plan ? ?ASA: 2 ? ?Anesthesia Plan: General and Regional  ? ?Post-op Pain Management: Regional block*  ? ?Induction: Intravenous ? ?PONV Risk Score and Plan: 3 and Ondansetron, Dexamethasone and Treatment may vary due to age or medical condition ? ?Airway Management Planned: Oral ETT ? ?Additional Equipment:  ? ?Intra-op Plan:  ? ?Post-operative Plan: Extubation in OR ? ?Informed Consent: I have reviewed the patients History and Physical, chart, labs and discussed the procedure including the risks, benefits and alternatives for the proposed anesthesia with the patient or authorized representative who has indicated his/her understanding and acceptance.  ? ? ? ?Dental advisory given ? ?Plan Discussed with: CRNA ? ?Anesthesia Plan Comments: (Plan for  preoperative interscalene nerve block and GETA.  Patient consented for risks of anesthesia including but not limited to:  ?- adverse reactions to medications ?- damage to eyes, teeth, lips or other oral mucosa ?- nerve damage due to positioning  ?- sore throat or hoarseness ?- damage to heart, brain, nerves, lungs, other parts of body or loss of life ? ?Informed patient about role of CRNA in peri- and intra-operative care.  Patient voiced understanding.)  ? ? ? ? ? ? ?Anesthesia Quick Evaluation ? ?

## 2021-12-21 NOTE — Transfer of Care (Signed)
Immediate Anesthesia Transfer of Care Note ? ?Patient: Maria Donaldson ? ?Procedure(s) Performed: TOTAL SHOULDER ARTHROPLASTY (Left: Shoulder) ? ?Patient Location: PACU ? ?Anesthesia Type:General ? ?Level of Consciousness: drowsy ? ?Airway & Oxygen Therapy: Patient Spontanous Breathing and Patient connected to face mask oxygen ? ?Post-op Assessment: Report given to RN ? ?Post vital signs: stable ? ?Last Vitals:  ?Vitals Value Taken Time  ?BP 113/59 12/21/21 1336  ?Temp    ?Pulse 58 12/21/21 1337  ?Resp 12 12/21/21 1337  ?SpO2 100 % 12/21/21 1337  ?Vitals shown include unvalidated device data. ? ?Last Pain:  ?Vitals:  ? 12/21/21 0809  ?PainSc: 0-No pain  ?   ? ?Patients Stated Pain Goal: 0 (12/21/21 0809) ? ?Complications: No notable events documented. ?

## 2021-12-21 NOTE — Evaluation (Signed)
Occupational Therapy Evaluation ?Patient Details ?Name: Maria Donaldson ?MRN: [id number] ?DOB: 02/29/1961 ?Today's Date: 12/21/2021 ? ? ?History of Present Illness Pt is 61 y/o female s/p L total shoulder arthroplasty.  ? ?Clinical Impression ?  ?Upon entering the room, pt seated in recliner chair with husband, Ronalee Belts, present for OT intervention and education. Pt lives at home with husband and is independent at baseline. OT reviewed NWB precautions, polar care, sling use, and wrist/hand/elbow exercises with pt and family as well as providing paper handout. Pt's husband returned demonstrations for UB dressing, donning polar care, and donning sling. Pt stands and ambulates to bathroom with close supervision without use of AD. She is able to manage LB clothing and hygiene . She then returns back to recliner chair with husband present. Pt with no further acute OT needs at this time. OT to SIGN OFF.  ?   ? ?Recommendations for follow up therapy are one component of a multi-disciplinary discharge planning process, led by the attending physician.  Recommendations may be updated based on patient status, additional functional criteria and insurance authorization.  ? ?Follow Up Recommendations ? Follow physician's recommendations for discharge plan and follow up therapies  ?  ?Assistance Recommended at Discharge Intermittent Supervision/Assistance  ?Patient can return home with the following A little help with bathing/dressing/bathroom;Help with stairs or ramp for entrance;Assistance with cooking/housework ? ?  ?   ?Equipment Recommendations ? None recommended by OT  ?  ?   ?Precautions / Restrictions Precautions ?Precautions: Shoulder ?Shoulder Interventions: Timmothy Sours joy ultra sling;Shoulder abduction pillow;At all times;Off for dressing/bathing/exercises ?Restrictions ?Weight Bearing Restrictions: Yes ?LUE Weight Bearing: Non weight bearing  ? ?  ? ?Mobility Bed Mobility ?  ?  ?  ?  ?  ?  ?  ?  ?  ? ?Transfers ?Overall transfer level:  Needs assistance ?Equipment used: None ?Transfers: Sit to/from Stand, Bed to chair/wheelchair/BSC ?Sit to Stand: Supervision ?  ?  ?Step pivot transfers: Supervision ?  ?  ?  ?  ? ?  ?Balance Overall balance assessment: Mild deficits observed, not formally tested ?  ?  ?  ?  ?  ?  ?  ?  ?  ?  ?  ?  ?  ?  ?  ?  ?  ?  ?   ? ?ADL either performed or assessed with clinical judgement  ? ?ADL   ?  ?  ?  ?  ?  ?  ?  ?  ?  ?  ?  ?  ?  ?  ?  ?  ?  ?  ?  ?General ADL Comments: min A for UB self care. Toileting with supervision and pt able to perform clothing management without assistance. Ambulation with supervision.  ? ? ? ?Vision Patient Visual Report: No change from baseline ?   ?   ?   ?   ? ?Pertinent Vitals/Pain Pain Assessment ?Pain Assessment: No/denies pain  ? ? ? ?   ?Extremity/Trunk Assessment Upper Extremity Assessment ?Upper Extremity Assessment: LUE deficits/detail ?LUE Deficits / Details: not tested seconday to NWB and sling after surgical intervention ?  ?  ?  ?  ?  ?Communication Communication ?Communication: No difficulties ?  ?Cognition Arousal/Alertness: Awake/alert ?Behavior During Therapy: [facility] for tasks assessed/performed ?Overall Cognitive Status: Within Functional Limits for tasks assessed ?  ?  ?  ?  ?  ?  ?  ?  ?  ?  ?  ?  ?  ?  ?  ?  ?  ?  ?  ?   ?   ?   ? ? ?  Home Living Family/patient expects to be discharged to:: Private residence ?Living Arrangements: Spouse/significant other ?Available Help at Discharge: Family ?Type of Home: House ?Home Access: Stairs to enter ?Entrance Stairs-Number of Steps: 1 step to enter home; 2 steps down to bedroom ?Entrance Stairs-Rails: None ?Home Layout:  (split level home) ?  ?  ?Bathroom Shower/Tub: Tub/shower unit ?  ?Bathroom Toilet: Standard ?  ?  ?Home Equipment: BSC/3in1;Grab bars - tub/shower ?  ?  ?  ? ?  ?Prior Functioning/Environment Prior Level of Function : Independent/Modified Independent ?  ?  ?  ?  ?  ?  ?  ?  ?  ? ?  ?  ?   ?   ?   ?OT Goals(Current  goals can be found in the care plan section) Acute Rehab OT Goals ?Patient Stated Goal: to go home ?OT Goal Formulation: With patient ?Time For Goal Achievement: 12/21/21 ?Potential to Achieve Goals: Fair  ?OT Frequency:   ?  ? ?   ?AM-PAC OT "6 Clicks" Daily Activity     ?Outcome Measure Help from another person eating meals?: None ?Help from another person taking care of personal grooming?: None ?Help from another person toileting, which includes using toliet, bedpan, or urinal?: None ?Help from another person bathing (including washing, rinsing, drying)?: A Little ?Help from another person to put on and taking off regular upper body clothing?: A Little ?Help from another person to put on and taking off regular lower body clothing?: A Little ?6 Click Score: 21 ?  ?End of Session Equipment Utilized During Treatment: Other (comment) (sling) ?Nurse Communication: Mobility status ? ?Activity Tolerance: Patient tolerated treatment well ?Patient left: in chair;with call bell/phone within reach;with nursing/sitter in room;with family/visitor present ? ?   ?              ?Time: 1532-1600 ?OT Time Calculation (min): 28 min ?Charges:  OT General Charges ?$OT Visit: 1 Visit ?OT Evaluation ?$OT Eval Moderate Complexity: 1 Mod ?OT Treatments ?$Self Care/Home Management : 23-37 mins ? ?Darleen Crocker, MS, OTR/L , CBIS ?ascom [phone]  ?12/21/21, 4:16 PM  ?

## 2021-12-21 NOTE — Op Note (Signed)
12/21/2021 ? ?1:41 PM ? ?Patient:   Maria Donaldson ? ?Pre-Op Diagnosis:   Severe degenerative joint disease, left shoulder. ? ?Post-Op Diagnosis:   Same with biceps tendinopathy, left shoulder. ? ?Procedure:   Anatomic left total shoulder arthroplasty with biceps tenodesis. ? ?Surgeon:   Pascal Lux, MD ? ?Assistant:   Waylan Boga, RNFA ? ?Anesthesia:   General endotracheal with an interscalene block using Exparel placed preoperatively by the anesthesiologist. ? ?Findings:   As above.  The rotator cuff was in satisfactory condition. ? ?Complications:   None ? ?EBL:   100 cc ? ?Fluids:   1000 cc crystalloid ? ?UOP:   None ? ?TT:   None ? ?Drains:   None ? ?Closure:   Staples ? ?Implants:   Ovo-Motion 46 x 50 mm press fit humeral head and 19 x 20 mm cemented glenoid hemi-cap. ? ?Brief Clinical Note:   The patient is a 61 year old with a long history of gradually worsening left shoulder pain. The patient's symptoms have progressed despite medications, activity modification, etc. The patient's history and examination are consistent with advanced degenerative joint disease confirmed by plain radiographs. A preoperative MRI scan showed that the rotator cuff remained in satisfactory condition. The patient presents at this time for an anatomic left total shoulder arthroplasty. ? ?Procedure:   The patient underwent placement of an interscalene block using Exparel by the anesthesiologist in the preoperative holding area before being brought into the operating room and lain in the supine position. The patient then underwent general endotracheal intubation and anesthesia before the patient was repositioned in the beach chair position using the beach chair positioner. The left shoulder and upper extremity were prepped with ChloraPrep solution before being draped sterilely. Preoperative antibiotics were administered.  ? ?A timeout was performed to verify the appropriate surgical site before a standard anterior approach to the  shoulder was made through an approximately 4-5 inch incision. The incision was carried down through the subcutaneous tissues to expose the deltopectoral fascia. The interval between the deltoid and pectoralis muscles was identified and this plane developed, retracting the cephalic vein laterally with the deltoid muscle. The conjoined tendon was identified. Its lateral margin was dissected and the Kolbel self-retraining retractor inserted. The "three sisters" were identified and cauterized. Bursal tissues were removed to improve visualization.  ? ?The biceps tendon was identified near the inferior aspect of the bicipital groove. A soft tissue tenodesis was performed by attaching the biceps tendon to the adjacent pectoralis major tendon using two #0 Ethibond interrupted sutures. The biceps tendon was then transected just proximal to the tenodesis site. The subscapularis tendon was released from its attachment to the lesser tuberosity 1 cm proximal to its insertion and several tagging sutures placed. The inferior capsule was released with care after identifying and protecting the axillary nerve.  ? ?The proximal humerus was carefully measured and found to be optimally replicated by the 46 x 50 mm humeral head. The appropriate guide was positioned and the central guidewire inserted. The centering shaft was advanced over the guidepin to the appropriate depth. The circumferential reamer was then advanced over the guidewire to ream down the humeral head before the access reamer was utilized and advanced fully. The centering shaft was removed along with the remaining bone plug before a protective metal plate was applied over the prepared surface. ? ?Attention was directed to the glenoid. The labrum was debrided circumferentially before the drill guide was positioned. The guidewire was drilled into the glenoid to the  appropriate depth. After verifying its position, it was overreamed with the inferior glenoid reamer, making  several stops to verify the appropriate depth and checking the depth with the appropriate trial component. The trial was positioned and the flexible peg drill inserted and advanced fully. Several additional holes were placed using the appropriate punch to optimize cement fixation. ? ?The glenoid was copiously irrigated with sterile saline solution using the pulse lavage before the surface was packed with a neosynephrine soaked piece of vaginal sponge. Meanwhile, the cement was mixed on the back table. When the cement was ready, a small amount of cement was inserted into the glenoid defect and pressurized digitally with the finger condom.  This process was repeated two more times before the permanent 19 x 20 mm component was cemented into place and held firmly using the pressurizer tool. The excess cement was removed using a Surveyor, quantity. Once the cemented hardened, the surface was carefully inspected and several small pieces of hardened cement removed. ? ?Attention was redirected to the humeral side. The appropriate sized preparation trial was positioned and secured using several short guidepins. The pilot drill was inserted to the appropriate depth. The 12 mm taper post was then inserted and advanced to the appropriate depth. Finally, the permanent 46 x 50 mm humeral articular component was positioned appropriately and impacted into place to seat the component onto the taper post. The adequacy of fixation was verified manually and with an osteotome, and found to be excellent.  The shoulder was reduced and placed through a range of motion.  It was stable with abduction and external rotation, and did not appear to be too tight. ? ?The wound was copiously irrigated with sterile saline solution using the jet lavage system before a total of 20 cc of Exparel diluted out to 60 cc with normal saline and 30 cc of 0.5% Sensorcaine with epinephrine was injected into the pericapsular and peri-incisional tissues to help  with postoperative analgesia. The subscapularis tendon was reapproximated using #2 FiberWire interrupted sutures. The deltopectoral interval was closed using #0 Vicryl interrupted sutures before the subcutaneous tissues were closed using 2-0 Vicryl interrupted sutures. The skin was closed using staples. Prior to closing the skin, 1 g of transexemic acid in 10 cc of normal saline was injected intra-articularly to help with postoperative bleeding. A sterile occlusive dressing was applied to the wound before the arm was placed into a shoulder immobilizer with an abduction pillow. A Polar Care system also was applied to the shoulder. The patient was then transferred back to a hospital bed before being awakened, extubated, and returned to the recovery room in satisfactory condition after tolerating the procedure well. ?

## 2021-12-21 NOTE — Discharge Instructions (Addendum)
Orthopedic discharge instructions: ?May shower with intact OpSite dressing. ?Apply ice frequently to shoulder or use Polar Care device. ?Take ibuprofen 600-800 mg TID with meals for 5-7 days, then as necessary. ?Take tramadol as prescribed when needed.  ?May supplement with ES Tylenol if necessary. ?Keep shoulder immobilizer on at all times except may remove for bathing purposes. ?Follow-up in 10-14 days or as scheduled.POLAR CARE INFORMATION ? ?http://jones.com/ ? ?How to use [place]?  YouTube   [email or web address] ? ?OPERATING INSTRUCTIONS ? ?Start the product ?With dry hands, connect the transformer to the electrical connection located on the top of the cooler. Next, plug the transformer into an appropriate electrical outlet. The unit will automatically start running at this point. ? ?To stop the pump, disconnect electrical power.  ?Unplug to stop the product when not in use. Unplugging the Polar Care unit turns it off. Always unplug immediately after use. Never leave it plugged in while unattended. Remove pad.  ?  ?FIRST ADD WATER TO FILL LINE, THEN ICE---Replace ice when existing ice is almost melted ? ?1 Discuss Treatment with your Licensed Health Care Practitioner and Use Only as Prescribed ?2 Apply Insulation Barrier & Cold Therapy Pad ?3 Check for Moisture ?4 Inspect Skin Regularly ? ?Tips and Armed forces technical officer Tips ?1. Use cubed or chunked ice for optimal performance. ?2. It is recommended to drain the Pad between uses. To drain the pad, hold the Pad upright with the hose pointed toward the ground. Depress the black plunger and allow water to drain out. ?3. You may disconnect the Pad from the unit without removing the pad from the affected area by depressing the silver tabs on the hose coupling and gently pulling the hoses apart. The Pad and unit will seal itself and will not leak. Note: Some dripping during release is normal. ?4. DO NOT RUN PUMP  WITHOUT WATER! The pump in this unit is designed to run with water. Running the unit without water will cause permanent damage to the pump. ?5. Unplug unit before removing lid. ? ?TROUBLESHOOTING GUIDE ?Pump not running, Water not flowing to the pad, Pad is not getting cold ?1. Make sure the transformer is plugged into the wall outlet. ?2. Confirm that the ice and water are filled to the indicated levels. ?3. Make sure there are no kinks in the pad. ?4. Gently pull on the blue tube to make sure the tube/pad junction is straight. ?5. Remove the pad from the treatment site and ll it while the pad is lying at; then reapply. ?6. Confirm that the pad couplings are securely attached to the unit. Listen for the double clicks (Figure 1) to confirm the pad couplings are securely attached. ? ?Leaks    Note: Some condensation on the lines, controller, and pads is unavoidable, especially in warmer climates. ?1. If using a Breg Polar Care Cold Therapy unit with a detachable Cold Therapy Pad, and a leak exists (other than condensation on the lines) disconnect the pad couplings. Make sure the silver tabs on the couplings are depressed before reconnecting the pad to the pump hose; then confirm both sides of the coupling are properly clicked in. ?2. If the coupling continues to leak or a leak is detected in the pad itself, stop using it and call [place] at [phone]. ? ?Cleaning ?After use, empty and dry the unit with a soft cloth. Warm water and mild detergent may be used occasionally to clean the pump and  tubes. ? ?WARNING: The [place] can be cold enough to cause serious injury, including full skin necrosis. Follow these Operating Instructions, and carefully read the Product Insert (see pouch on side of unit) and the Cold Therapy Pad Fitting Instructions (provided with each Cold Therapy Pad) prior to use. ? ? ? ? ? ?  POLAR CARE INFORMATION ? ?http://jones.com/ ? ?How to use [place]?  YouTube   [email or web address] ? ?OPERATING INSTRUCTIONS ? ?Start the product ?With dry hands, connect the transformer to the electrical connection located on the top of the cooler. Next, plug the transformer into an appropriate electrical outlet. The unit will automatically start running at this point. ? ?To stop the pump, disconnect electrical power.  ?Unplug to stop the product when not in use. Unplugging the Polar Care unit turns it off. Always unplug immediately after use. Never leave it plugged in while unattended. Remove pad.  ?  ?FIRST ADD WATER TO FILL LINE, THEN ICE---Replace ice when existing ice is almost melted ? ?1 Discuss Treatment with your Licensed Health Care Practitioner and Use Only as Prescribed ?2 Apply Insulation Barrier & Cold Therapy Pad ?3 Check for Moisture ?4 Inspect Skin Regularly ? ?Tips and Armed forces technical officer Tips ?1. Use cubed or chunked ice for optimal performance. ?2. It is recommended to drain the Pad between uses. To drain the pad, hold the Pad upright with the hose pointed toward the ground. Depress the black plunger and allow water to drain out. ?3. You may disconnect the Pad from the unit without removing the pad from the affected area by depressing the silver tabs on the hose coupling and gently pulling the hoses apart. The Pad and unit will seal itself and will not leak. Note: Some dripping during release is normal. ?4. DO NOT RUN PUMP WITHOUT WATER! The pump in this unit is designed to run with water. Running the unit without water will cause permanent damage to the pump. ?5. Unplug unit before removing lid. ? ?TROUBLESHOOTING GUIDE ?Pump not running, Water not flowing to the pad, Pad is not getting cold ?1. Make sure the transformer is plugged into the wall outlet. ?2. Confirm that the ice and water are filled to the indicated levels. ?3. Make sure there are no kinks in the pad. ?4. Gently pull on the blue tube to make sure the tube/pad  junction is straight. ?5. Remove the pad from the treatment site and ll it while the pad is lying at; then reapply. ?6. Confirm that the pad couplings are securely attached to the unit. Listen for the double clicks (Figure 1) to confirm the pad couplings are securely attached. ? ?Leaks    Note: Some condensation on the lines, controller, and pads is unavoidable, especially in warmer climates. ?1. If using a Breg Polar Care Cold Therapy unit with a detachable Cold Therapy Pad, and a leak exists (other than condensation on the lines) disconnect the pad couplings. Make sure the silver tabs on the couplings are depressed before reconnecting the pad to the pump hose; then confirm both sides of the coupling are properly clicked in. ?2. If the coupling continues to leak or a leak is detected in the pad itself, stop using it and call [place] at [phone]. ? ?Cleaning ?After use, empty and dry the unit with a soft cloth. Warm water and mild detergent may be used occasionally to clean the pump and tubes. ? ?WARNING: The Polar Care  Cube can be cold enough to cause serious injury, including full skin necrosis. Follow these Operating Instructions, and carefully read the Product Insert (see pouch on side of unit) and the Cold Therapy Pad Fitting Instructions (provided with each Cold Therapy Pad) prior to use. ? ? ? ? ? ?  SHOULDER SLING IMMOBILIZER  ? ?VIDEO ?Slingshot 2 Shoulder Brace Application - YouTube ---[email or web address]://www.willis-schwartz.biz/ ? ?INSTRUCTIONS ?While supporting the injured arm, slide the forearm into the sling. Wrap the adjustable shoulder strap around the neck and shoulders and attach the strap end to the sling using  the ?alligator strap tab.?  ?Adjust the shoulder strap to the required length. ?Position the shoulder pad behind the neck. ?To secure the shoulder pad location (optional), pull the shoulder strap away from the shoulder pad, unfold the hook material on the top of the pad,  then press the shoulder strap back onto the hook material to secure the pad in place. ?Attach the closure strap across the open top of the sling. Position the strap so that it holds the arm securely in t

## 2021-12-22 ENCOUNTER — Encounter: Payer: Self-pay | Admitting: Surgery

## 2022-10-16 ENCOUNTER — Telehealth: Payer: Self-pay | Admitting: Internal Medicine

## 2022-10-16 NOTE — Telephone Encounter (Signed)
Left vm to confirm 10/23/2022 appointment-Toni

## 2022-10-23 ENCOUNTER — Ambulatory Visit: Payer: BC Managed Care – PPO | Admitting: Nurse Practitioner

## 2022-10-23 ENCOUNTER — Encounter: Payer: Self-pay | Admitting: Nurse Practitioner

## 2022-10-23 VITALS — BP 119/67 | HR 60 | Temp 97.4°F | Resp 16 | Ht 60.0 in | Wt 122.4 lb

## 2022-10-23 DIAGNOSIS — N951 Menopausal and female climacteric states: Secondary | ICD-10-CM | POA: Diagnosis not present

## 2022-10-23 DIAGNOSIS — E559 Vitamin D deficiency, unspecified: Secondary | ICD-10-CM | POA: Diagnosis not present

## 2022-10-23 DIAGNOSIS — Z23 Encounter for immunization: Secondary | ICD-10-CM

## 2022-10-23 DIAGNOSIS — E782 Mixed hyperlipidemia: Secondary | ICD-10-CM | POA: Diagnosis not present

## 2022-10-23 DIAGNOSIS — E538 Deficiency of other specified B group vitamins: Secondary | ICD-10-CM

## 2022-10-23 DIAGNOSIS — R5383 Other fatigue: Secondary | ICD-10-CM

## 2022-10-23 MED ORDER — SERTRALINE HCL 100 MG PO TABS: ORAL_TABLET | ORAL | 1 refills | Status: DC

## 2022-10-23 MED ORDER — TETANUS-DIPHTH-ACELL PERTUSSIS 5-2.5-18.5 LF-MCG/0.5 IM SUSP: 0.5000 mL | Freq: Once | INTRAMUSCULAR | 0 refills | Status: AC

## 2022-10-23 MED ORDER — PREMPRO 0.625-2.5 MG PO TABS: 1.0000 | ORAL_TABLET | Freq: Every day | ORAL | 1 refills | Status: DC

## 2022-10-23 NOTE — Progress Notes (Signed)
[facility] [place], [place] [address]  Internal MEDICINE  Office Visit Note  Patient Name: Maria Donaldson  [id number]  [id number]  Date of Service: 10/23/2022   Complaints/HPI Pt is here for establishment of PCP. Chief Complaint  Patient presents with   New Patient (Initial Visit)    HPI Maria Donaldson presents for a new patient visit to establish care.  Well-appearing 62 y.o. 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  Has tried several different medications for ADHD, caffeine as well. Long acting adderall, short acting adderall. Has not tried methylphenidate, dexmethylphenidate, vyvanse, modafinil, armodafinil.  Tobacco use: no Alcohol use: rarely Illicit drug use: none  Routine CRC screening: was done 7 years ago, due in 2027 Routine mammogram: done last summer  Pap smear: 3 years ago Labs: due for routine labs  New or worsening pain: arthritis      10/23/2022    1:53 PM  Depression screen PHQ 2/9  Decreased Interest 0  Down, Depressed, Hopeless 0  PHQ - 2 Score 0       Current Medication: Outpatient Encounter Medications as of 10/23/2022  Medication Sig   amphetamine-dextroamphetamine (ADDERALL) 20 MG tablet Take 20 mg by mouth See admin instructions. Take 1 tablet (20 mg) by mouth scheduled in the morning, may take an additional dose in the evening if needed for focus   [EXPIRED] Tdap (BOOSTRIX) 5-2.5-18.5 LF-MCG/0.5 injection Inject 0.5 mLs into the muscle once for 1 dose.   traMADol (ULTRAM) 50 MG tablet Take 1 tablet (50 mg total) by mouth every 6 (six) hours as needed for moderate pain.   [DISCONTINUED] ondansetron (ZOFRAN-ODT) 4 MG disintegrating tablet Take 1 tablet (4 mg total) by mouth every 6 (six) hours as needed for nausea or vomiting.   PREMPRO 0.625-2.5 MG tablet Take 1 tablet by mouth  daily.   sertraline (ZOLOFT) 100 MG tablet Take 1 tablet by mouth daily.   [DISCONTINUED] PREMPRO 0.625-2.5 MG tablet Take 1 tablet by mouth daily.   [DISCONTINUED] sertraline (ZOLOFT) 100 MG tablet    No facility-administered encounter medications on file as of 10/23/2022.    Surgical History: Past Surgical History:  Procedure Laterality Date   BREAST SURGERY Bilateral    augmentation   HAND SURGERY Left    fused joint on first finger   TOTAL HIP ARTHROPLASTY Right 10/06/2019   Procedure: TOTAL HIP ARTHROPLASTY;  Surgeon: Corky Mull, MD;  Location: [facility] ORS;  Service: Orthopedics;  Laterality: Right;   TOTAL SHOULDER ARTHROPLASTY Left 12/21/2021   Procedure: TOTAL SHOULDER ARTHROPLASTY;  Surgeon: Corky Mull, MD;  Location: [facility] ORS;  Service: Orthopedics;  Laterality: Left;    Medical History: Past Medical History:  Diagnosis Date   ADHD (attention deficit hyperactivity disorder)    Arthritis    Basal cell carcinoma 02/15/2021   R glabella, [facility] 03/14/21   Family history of adverse reaction to anesthesia    mom-hard time waking up   Seizure [facility])    only in her 20's due to medication    Family History: History reviewed. No pertinent family history.  Social History   Socioeconomic History   Marital status: Married    Spouse name: Not on file   Number of children: Not on file   Years of education: Not on file   Highest education level: Not on file  Occupational History   Not on file  Tobacco Use   Smoking status: Never   Smokeless tobacco: Never  Vaping Use   Vaping Use: Never used  Substance and Sexual Activity   Alcohol use: Yes    Comment: glass of wine a month   Drug use: Never   Sexual activity: Yes  Other Topics Concern   Not on file  Social History Narrative   Not on file   Social Determinants of Health   Financial Resource Strain: Not on file  Food Insecurity: Not on file  Transportation Needs: Not on file   Physical Activity: Not on file  Stress: Not on file  Social Connections: Not on file  Intimate Partner Violence: Not on file     Review of Systems  Constitutional:  Positive for fatigue. Negative for chills and unexpected weight change.  HENT:  Negative for congestion, rhinorrhea, sneezing and sore throat.   Eyes:  Negative for redness.  Respiratory: Negative.  Negative for cough, chest tightness, shortness of breath and wheezing.   Cardiovascular: Negative.  Negative for chest pain and palpitations.  Gastrointestinal: Negative.  Negative for abdominal pain, constipation, diarrhea, nausea and vomiting.  Genitourinary:  Negative for dysuria and frequency.  Musculoskeletal:  Positive for arthralgias. Negative for back pain, joint swelling and neck pain.  Skin:  Negative for rash.  Neurological: Negative.  Negative for tremors and numbness.  Hematological:  Negative for adenopathy. Does not bruise/bleed easily.  Psychiatric/Behavioral:  Negative for behavioral problems (Depression), sleep disturbance and suicidal ideas. The patient is not nervous/anxious.     Vital Signs: BP 119/67   Pulse 60   Temp (!) 97.4 F (36.3 C)   Resp 16   Ht 5' (1.524 m)   Wt 122 lb 6.4 oz (55.5 kg)   SpO2 98%   BMI 23.90 kg/m    Physical Exam Vitals reviewed.  Constitutional:      General: She is not in acute distress.    Appearance: Normal appearance. She is normal weight. She is not ill-appearing.  HENT:     Head: Normocephalic and atraumatic.  Eyes:     Pupils: Pupils are equal, round, and reactive to light.  Cardiovascular:     Rate and Rhythm: Normal rate and regular rhythm.  Pulmonary:     Effort: Pulmonary effort is normal. No respiratory distress.  Neurological:     Mental Status: She is alert and oriented to person, place, and time.  Psychiatric:        Mood and Affect: Mood normal.        Behavior: Behavior normal.       Assessment/Plan: 1. Vasomotor symptoms due to  menopause Routine and additional labs ordered. Patient medications refilled including prempro and sertraline.  - CBC with Differential/Platelet - CMP14+EGFR - Lipid Profile - B12 and Folate Panel - Iron, TIBC and Ferritin Panel - TSH + free T4 - sertraline (ZOLOFT) 100 MG tablet; Take 1 tablet by mouth daily.  Dispense: 90 tablet; Refill: 1 - PREMPRO 0.625-2.5 MG tablet; Take 1 tablet by mouth daily.  Dispense: 84 tablet; Refill: 1  2. Other fatigue Routine and additional labs ordered for further evaluation - CBC with Differential/Platelet - CMP14+EGFR - Lipid Profile - B12 and Folate Panel - Iron, TIBC and Ferritin Panel - Vitamin D (25 hydroxy) - TSH + free T4  3. Mixed hyperlipidemia Routine labs ordered - CBC with Differential/Platelet - CMP14+EGFR - Lipid Profile - B12 and Folate Panel - Iron, TIBC  and Ferritin Panel - Vitamin D (25 hydroxy) - TSH + free T4  4. Vitamin D deficiency Routine lab ordered - Vitamin D (25 hydroxy)  5. B12 deficiency Routine labs ordered - CBC with Differential/Platelet - B12 and Folate Panel - Iron, TIBC and Ferritin Panel  6. Need for vaccination - Tdap (Juda) 5-2.5-18.5 LF-MCG/0.5 injection; Inject 0.5 mLs into the muscle once for 1 dose.  Dispense: 0.5 mL; Refill: 0    General Counseling: Blakeleigh verbalizes understanding of the findings of todays visit and agrees with plan of treatment. I have discussed any further diagnostic evaluation that may be needed or ordered today. We also reviewed her medications today. she has been encouraged to call the office with any questions or concerns that should arise related to todays visit.    Orders Placed This Encounter  Procedures   CBC with Differential/Platelet   CMP14+EGFR   Lipid Profile   B12 and Folate Panel   Iron, TIBC and Ferritin Panel   Vitamin D (25 hydroxy)   TSH + free T4    Meds ordered this encounter  Medications   Tdap (BOOSTRIX) 5-2.5-18.5 LF-MCG/0.5  injection    Sig: Inject 0.5 mLs into the muscle once for 1 dose.    Dispense:  0.5 mL    Refill:  0   sertraline (ZOLOFT) 100 MG tablet    Sig: Take 1 tablet by mouth daily.    Dispense:  90 tablet    Refill:  1   PREMPRO 0.625-2.5 MG tablet    Sig: Take 1 tablet by mouth daily.    Dispense:  84 tablet    Refill:  1    Return for CPE/PAP, Mikhaila Roh PCP earliest opening available. .  Time spent:30 Minutes Time spent with patient included reviewing progress notes, labs, imaging studies, and discussing plan for follow up.   [place] Controlled Substance Database was reviewed by me for overdose risk score (ORS)   This patient was seen by Jonetta Osgood, FNP-C in collaboration with Dr. Clayborn Bigness as a part of collaborative care agreement.   Destine Ambroise R. Valetta Fuller, MSN, FNP-C Internal Medicine

## 2022-10-27 ENCOUNTER — Encounter: Payer: Self-pay | Admitting: Nurse Practitioner

## 2022-11-16 NOTE — Progress Notes (Signed)
We will discuss lab results at upcoming appointment next week

## 2022-11-20 LAB — LIPID PANEL
Chol/HDL Ratio: 2.8 ratio (ref 0.0–4.4)
Cholesterol, Total: 187 mg/dL (ref 100–199)
HDL: 68 mg/dL (ref >39)
LDL Chol Calc (NIH): 101 mg/dL — ABNORMAL HIGH (ref 0–99)
Triglycerides: 99 mg/dL (ref 0–149)
VLDL Cholesterol Cal: 18 mg/dL (ref 5–40)

## 2022-11-20 LAB — CBC WITH DIFFERENTIAL/PLATELET
Basophils Absolute: 0 10*3/uL (ref 0.0–0.2)
Basos: 1 %
EOS (ABSOLUTE): 0.1 10*3/uL (ref 0.0–0.4)
Eos: 3 %
Hematocrit: 38 % (ref 34.0–46.6)
Hemoglobin: 13.1 g/dL (ref 11.1–15.9)
Immature Grans (Abs): 0 10*3/uL (ref 0.0–0.1)
Immature Granulocytes: 0 %
Lymphocytes Absolute: 1.1 10*3/uL (ref 0.7–3.1)
Lymphs: 25 %
MCH: 31.7 pg (ref 26.6–33.0)
MCHC: 34.5 g/dL (ref 31.5–35.7)
MCV: 92 fL (ref 79–97)
Monocytes Absolute: 0.4 10*3/uL (ref 0.1–0.9)
Monocytes: 9 %
Neutrophils Absolute: 2.7 10*3/uL (ref 1.4–7.0)
Neutrophils: 62 %
Platelets: 195 10*3/uL (ref 150–450)
RBC: 4.13 x10E6/uL (ref 3.77–5.28)
RDW: 12.1 % (ref 11.7–15.4)
WBC: 4.4 10*3/uL (ref 3.4–10.8)

## 2022-11-20 LAB — TSH+FREE T4
Free T4: 0.94 ng/dL (ref 0.82–1.77)
TSH: 4.52 u[IU]/mL — ABNORMAL HIGH (ref 0.450–4.500)

## 2022-11-20 LAB — B12 AND FOLATE PANEL
Folate: 16.5 ng/mL (ref >3.0)
Vitamin B-12: 426 pg/mL (ref 232–1245)

## 2022-11-20 LAB — IRON,TIBC AND FERRITIN PANEL
Ferritin: 73 ng/mL (ref 15–150)
Iron Saturation: 45 % (ref 15–55)
Iron: 117 ug/dL (ref 27–139)
Total Iron Binding Capacity: 258 ug/dL (ref 250–450)
UIBC: 141 ug/dL (ref 118–369)

## 2022-11-20 LAB — CMP14+EGFR
ALT: 8 IU/L (ref 0–32)
AST: 13 IU/L (ref 0–40)
Albumin/Globulin Ratio: 1.7 (ref 1.2–2.2)
Albumin: 4 g/dL (ref 3.9–4.9)
Alkaline Phosphatase: 41 IU/L — ABNORMAL LOW (ref 44–121)
BUN/Creatinine Ratio: 20 (ref 12–28)
BUN: 14 mg/dL (ref 8–27)
Bilirubin Total: 0.4 mg/dL (ref 0.0–1.2)
CO2: 24 mmol/L (ref 20–29)
Calcium: 8.9 mg/dL (ref 8.7–10.3)
Chloride: 103 mmol/L (ref 96–106)
Creatinine, Ser: 0.71 mg/dL (ref 0.57–1.00)
Globulin, Total: 2.3 g/dL (ref 1.5–4.5)
Glucose: 82 mg/dL (ref 70–99)
Potassium: 4.5 mmol/L (ref 3.5–5.2)
Sodium: 139 mmol/L (ref 134–144)
Total Protein: 6.3 g/dL (ref 6.0–8.5)
eGFR: 96 mL/min/{1.73_m2} (ref >59)

## 2022-11-20 LAB — VITAMIN D 25 HYDROXY (VIT D DEFICIENCY, FRACTURES): Vit D, 25-Hydroxy: 39 ng/mL (ref 30.0–100.0)

## 2022-11-21 ENCOUNTER — Encounter: Payer: Self-pay | Admitting: Nurse Practitioner

## 2022-11-21 ENCOUNTER — Ambulatory Visit (INDEPENDENT_AMBULATORY_CARE_PROVIDER_SITE_OTHER): Payer: BC Managed Care – PPO | Admitting: Nurse Practitioner

## 2022-11-21 VITALS — BP 129/78 | HR 60 | Temp 98.0°F | Resp 16 | Ht 60.0 in | Wt 123.2 lb

## 2022-11-21 DIAGNOSIS — R300 Dysuria: Secondary | ICD-10-CM

## 2022-11-21 DIAGNOSIS — R5383 Other fatigue: Secondary | ICD-10-CM

## 2022-11-21 DIAGNOSIS — Z124 Encounter for screening for malignant neoplasm of cervix: Secondary | ICD-10-CM

## 2022-11-21 DIAGNOSIS — E038 Other specified hypothyroidism: Secondary | ICD-10-CM

## 2022-11-21 DIAGNOSIS — Z0001 Encounter for general adult medical examination with abnormal findings: Secondary | ICD-10-CM

## 2022-11-21 MED ORDER — TETANUS-DIPHTH-ACELL PERTUSSIS 5-2.5-18.5 LF-MCG/0.5 IM SUSP: 0.5000 mL | Freq: Once | INTRAMUSCULAR | 0 refills | Status: AC

## 2022-11-21 MED ORDER — LEVOTHYROXINE SODIUM 25 MCG PO TABS: 25.0000 ug | ORAL_TABLET | Freq: Every day | ORAL | 2 refills | Status: DC

## 2022-11-21 NOTE — Progress Notes (Signed)
[facility] [place], [place] [address]  Internal MEDICINE  Office Visit Note  Patient Name: Maria Donaldson  [id number]  [id number]  Date of Service: 11/21/2022  Chief Complaint  Patient presents with   Annual Exam    HPI Maria Donaldson presents for an annual well visit and physical exam.  Well-appearing 62 y.o. female with fatigue and anxiety.  Routine CRC screening: colonoscopy due in 5 years Routine mammogram: done last year  Pap smear: done today Labs: discussed  New or worsening pain: none  Other concerns: Elevated TSH -- try levothyroxine --have hair loss, brittle nails, weight gain and fatigue   Current Medication: Outpatient Encounter Medications as of 11/21/2022  Medication Sig   amphetamine-dextroamphetamine (ADDERALL) 20 MG tablet Take 20 mg by mouth See admin instructions. Take 1 tablet (20 mg) by mouth scheduled in the morning, may take an additional dose in the evening if needed for focus   levothyroxine (SYNTHROID) 25 MCG tablet Take 1 tablet (25 mcg total) by mouth daily.   PREMPRO 0.625-2.5 MG tablet Take 1 tablet by mouth daily.   sertraline (ZOLOFT) 100 MG tablet Take 1 tablet by mouth daily.   traMADol (ULTRAM) 50 MG tablet Take 1 tablet (50 mg total) by mouth every 6 (six) hours as needed for moderate pain.   [DISCONTINUED] Tdap (BOOSTRIX) 5-2.5-18.5 LF-MCG/0.5 injection Inject 0.5 mLs into the muscle once.   Tdap (BOOSTRIX) 5-2.5-18.5 LF-MCG/0.5 injection Inject 0.5 mLs into the muscle once for 1 dose.   No facility-administered encounter medications on file as of 11/21/2022.    Surgical History: Past Surgical History:  Procedure Laterality Date   BREAST SURGERY Bilateral    augmentation   HAND SURGERY Left    fused joint on first finger   TOTAL HIP ARTHROPLASTY Right 10/06/2019   Procedure: TOTAL HIP ARTHROPLASTY;  Surgeon: Corky Mull, MD;  Location: [facility] ORS;  Service: Orthopedics;  Laterality: Right;   TOTAL SHOULDER ARTHROPLASTY  Left 12/21/2021   Procedure: TOTAL SHOULDER ARTHROPLASTY;  Surgeon: Corky Mull, MD;  Location: [facility] ORS;  Service: Orthopedics;  Laterality: Left;    Medical History: Past Medical History:  Diagnosis Date   ADHD (attention deficit hyperactivity disorder)    Arthritis    Basal cell carcinoma 02/15/2021   R glabella, MOHs, [facility] 03/14/21   Family history of adverse reaction to anesthesia    mom-hard time waking up   Seizure [facility])    only in her 20's due to medication    Family History: History reviewed. No pertinent family history.  Social History   Socioeconomic History   Marital status: Married    Spouse name: Not on file   Number of children: Not on file   Years of education: Not on file   Highest education level: Not on file  Occupational History   Not on file  Tobacco Use   Smoking status: Never   Smokeless tobacco: Never  Vaping Use   Vaping Use: Never used  Substance and Sexual Activity   Alcohol use: Yes    Comment: glass of wine a month   Drug use: Never   Sexual activity: Yes  Other Topics Concern   Not on file  Social History Narrative   Not on file   Social Determinants of Health   Financial Resource Strain: Not on file  Food Insecurity: Not on file  Transportation Needs: Not on file  Physical Activity: Not on file  Stress: Not on file  Social Connections: Not on file  Intimate Partner Violence: Not on file      Review of Systems  Constitutional:  Positive for fatigue and unexpected weight change. Negative for activity change, appetite change, chills and fever.  HENT: Negative.  Negative for congestion, ear pain, rhinorrhea, sore throat and trouble swallowing.        Hair loss and brittle nails  Eyes: Negative.   Respiratory: Negative.  Negative for cough, chest tightness, shortness of breath and wheezing.   Cardiovascular: Negative.  Negative for chest pain and palpitations.  Gastrointestinal: Negative.  Negative  for abdominal pain, blood in stool, constipation, diarrhea, nausea and vomiting.  Endocrine: Negative.   Genitourinary: Negative.  Negative for difficulty urinating, dysuria, frequency, hematuria and urgency.  Musculoskeletal:  Negative for arthralgias, back pain, joint swelling, myalgias and neck pain.  Skin: Negative.  Negative for rash and wound.  Allergic/Immunologic: Negative.  Negative for immunocompromised state.  Neurological: Negative.  Negative for dizziness, seizures, numbness and headaches.  Hematological: Negative.   Psychiatric/Behavioral:  Negative for behavioral problems, self-injury, sleep disturbance and suicidal ideas. The patient is nervous/anxious.     Vital Signs: BP 129/78   Pulse 60   Temp 98 F (36.7 C)   Resp 16   Ht 5' (1.524 m)   Wt 123 lb 3.2 oz (55.9 kg)   SpO2 98%   BMI 24.06 kg/m    Physical Exam Vitals reviewed.  Constitutional:      General: She is not in acute distress.    Appearance: She is well-developed. She is not diaphoretic.  HENT:     Head: Normocephalic and atraumatic.     Right Ear: External ear normal.     Left Ear: External ear normal.     Nose: Nose normal.     Mouth/Throat:     Pharynx: No oropharyngeal exudate.  Eyes:     General: No scleral icterus.       Right eye: No discharge.        Left eye: No discharge.     Conjunctiva/sclera: Conjunctivae normal.     Pupils: Pupils are equal, round, and reactive to light.  Neck:     Thyroid: No thyromegaly.     Vascular: No JVD.     Trachea: No tracheal deviation.  Cardiovascular:     Rate and Rhythm: Normal rate and regular rhythm.     Heart sounds: Normal heart sounds. No murmur heard.    No friction rub. No gallop.  Pulmonary:     Effort: Pulmonary effort is normal. No respiratory distress.     Breath sounds: Normal breath sounds. No stridor. No wheezing or rales.  Chest:     Chest wall: No tenderness.  Abdominal:     General: Bowel sounds are normal. There is no  distension.     Palpations: Abdomen is soft. There is no mass.     Tenderness: There is no abdominal tenderness. There is no guarding or rebound.     Hernia: There is no hernia in the left inguinal area or right inguinal area.  Genitourinary:    General: Normal vulva.     Pubic Area: No rash or pubic lice.      Labia:        Right: No rash, tenderness, lesion or injury.        Left: No rash, tenderness, lesion or injury.      Urethra: No prolapse, urethral swelling or urethral lesion.     Vagina:  Normal. No signs of injury and foreign body. No vaginal discharge, erythema, tenderness, bleeding, lesions or prolapsed vaginal walls.     Cervix: Normal.     Uterus: Normal.      Adnexa: Right adnexa normal and left adnexa normal.     Rectum: Normal.  Musculoskeletal:        General: No tenderness or deformity. Normal range of motion.     Cervical back: Normal range of motion and neck supple.  Lymphadenopathy:     Cervical: No cervical adenopathy.     Lower Body: No right inguinal adenopathy. No left inguinal adenopathy.  Skin:    General: Skin is warm and dry.     Coloration: Skin is not pale.     Findings: No erythema or rash.  Neurological:     Mental Status: She is alert.     Cranial Nerves: No cranial nerve deficit.     Motor: No abnormal muscle tone.     Coordination: Coordination normal.     Deep Tendon Reflexes: Reflexes are normal and symmetric.  Psychiatric:        Behavior: Behavior normal.        Thought Content: Thought content normal.        Judgment: Judgment normal.        Assessment/Plan: 1. Encounter for routine adult health examination with abnormal findings Age-appropriate preventive screenings and vaccinations discussed, annual physical exam completed. Routine labs for health maintenance ordered, see below. PHM updated.  - Tdap (BOOSTRIX) 5-2.5-18.5 LF-MCG/0.5 injection; Inject 0.5 mLs into the muscle once for 1 dose.  Dispense: 0.5 mL; Refill: 0  2. Other  specified hypothyroidism Routine lab ordered. Restart levothyroxine, labs due in 6 weeks.  - levothyroxine (SYNTHROID) 25 MCG tablet; Take 1 tablet (25 mcg total) by mouth daily.  Dispense: 30 tablet; Refill: 2 - TSH + free T4  3. Other fatigue Routine labs ordered - TSH + free T4  4. Dysuria Routine urinalysis done  - UA/M w/rflx Culture, Routine  5. Encounter for screening for malignant neoplasm of cervix Normal pelvic exam done with routine pap smear.  - IGP, Aptima HPV     General Counseling: areya haddow understanding of the findings of todays visit and agrees with plan of treatment. I have discussed any further diagnostic evaluation that may be needed or ordered today. We also reviewed her medications today. she has been encouraged to call the office with any questions or concerns that should arise related to todays visit.    Orders Placed This Encounter  Procedures   UA/M w/rflx Culture, Routine   TSH + free T4    Meds ordered this encounter  Medications   Tdap (BOOSTRIX) 5-2.5-18.5 LF-MCG/0.5 injection    Sig: Inject 0.5 mLs into the muscle once for 1 dose.    Dispense:  0.5 mL    Refill:  0   levothyroxine (SYNTHROID) 25 MCG tablet    Sig: Take 1 tablet (25 mcg total) by mouth daily.    Dispense:  30 tablet    Refill:  2    Return in about 6 weeks (around 01/04/2023) for F/U, Labs, Johnay Mano PCP.   Total time spent:30 Minutes Time spent includes review of chart, medications, test results, and follow up plan with the patient.    Controlled Substance Database was reviewed by me.  This patient was seen by Jonetta Osgood, FNP-C in collaboration with Dr. Clayborn Bigness as a part of collaborative care agreement.  Kelseigh Diver R. Valetta Fuller, MSN,  FNP-C Internal medicine

## 2022-11-22 LAB — MICROSCOPIC EXAMINATION
Bacteria, UA: NONE SEEN
Casts: NONE SEEN /lpf
Epithelial Cells (non renal): NONE SEEN /hpf (ref 0–10)
RBC, Urine: NONE SEEN /hpf (ref 0–2)
WBC, UA: NONE SEEN /hpf (ref 0–5)

## 2022-11-22 LAB — UA/M W/RFLX CULTURE, ROUTINE
Bilirubin, UA: NEGATIVE
Glucose, UA: NEGATIVE
Ketones, UA: NEGATIVE
Leukocytes,UA: NEGATIVE
Nitrite, UA: NEGATIVE
Protein,UA: NEGATIVE
RBC, UA: NEGATIVE
Specific Gravity, UA: 1.009 (ref 1.005–1.030)
Urobilinogen, Ur: 0.2 mg/dL (ref 0.2–1.0)
pH, UA: 5 (ref 5.0–7.5)

## 2022-11-28 LAB — IGP, APTIMA HPV: HPV Aptima: NEGATIVE

## 2022-12-11 NOTE — Progress Notes (Signed)
Please let the patient know that her pap was normal and negative for HPV, follow up in 3 years for repeat pap smear.

## 2022-12-13 ENCOUNTER — Telehealth: Payer: Self-pay

## 2022-12-13 NOTE — Telephone Encounter (Signed)
Left message for patient to give office a call back.  

## 2022-12-24 ENCOUNTER — Telehealth: Payer: Self-pay

## 2022-12-24 NOTE — Telephone Encounter (Addendum)
Sent message through [facility]  and left several message and pt advised about pap is normal repeat in 3 years

## 2022-12-24 NOTE — Telephone Encounter (Signed)
-----   Message from Jonetta Osgood, NP sent at 12/11/2022  9:13 PM EDT ----- Please let the patient know that her pap was normal and negative for HPV, follow up in 3 years for repeat pap smear.

## 2023-01-05 LAB — TSH+FREE T4
Free T4: 1.07 ng/dL (ref 0.82–1.77)
TSH: 3.09 u[IU]/mL (ref 0.450–4.500)

## 2023-01-08 ENCOUNTER — Encounter: Payer: Self-pay | Admitting: Nurse Practitioner

## 2023-01-08 ENCOUNTER — Ambulatory Visit (INDEPENDENT_AMBULATORY_CARE_PROVIDER_SITE_OTHER): Payer: BC Managed Care – PPO | Admitting: Nurse Practitioner

## 2023-01-08 VITALS — BP 119/83 | HR 65 | Temp 98.4°F | Resp 16 | Ht 60.0 in | Wt 126.2 lb

## 2023-01-08 DIAGNOSIS — E038 Other specified hypothyroidism: Secondary | ICD-10-CM

## 2023-01-08 MED ORDER — LEVOTHYROXINE SODIUM 50 MCG PO TABS: 50.0000 ug | ORAL_TABLET | Freq: Every day | ORAL | 3 refills | Status: DC

## 2023-01-08 NOTE — Progress Notes (Signed)
[facility] [address] [place], [place] [address]  Internal MEDICINE  Office Visit Note  Patient Name: Maria PullingJulia Donaldson  [id number] 18, 2062  [id number]  Date of Service: 01/08/2023  Chief Complaint  Patient presents with   Follow-up    Review labs.     HPI Maria Donaldson presents for a follow-up visit for hypothyroidism Still gaining weight, feeling tired , brittle nails .  TSH improved some but not at target for symptoms.     Current Medication: Outpatient Encounter Medications as of 01/08/2023  Medication Sig   amphetamine-dextroamphetamine (ADDERALL) 20 MG tablet Take 20 mg by mouth See admin instructions. Take 1 tablet (20 mg) by mouth scheduled in the morning, may take an additional dose in the evening if needed for focus   levothyroxine (SYNTHROID) 50 MCG tablet Take 1 tablet (50 mcg total) by mouth daily. On empty stomach   PREMPRO 0.625-2.5 MG tablet Take 1 tablet by mouth daily.   sertraline (ZOLOFT) 100 MG tablet Take 1 tablet by mouth daily.   [DISCONTINUED] levothyroxine (SYNTHROID) 25 MCG tablet Take 1 tablet (25 mcg total) by mouth daily.   No facility-administered encounter medications on file as of 01/08/2023.    Surgical History: Past Surgical History:  Procedure Laterality Date   BREAST SURGERY Bilateral    augmentation   HAND SURGERY Left    fused joint on first finger   TOTAL HIP ARTHROPLASTY Right 10/06/2019   Procedure: TOTAL HIP ARTHROPLASTY;  Surgeon: Christena FlakePoggi, John J, MD;  Location: [facility];  Service: Orthopedics;  Laterality: Right;   TOTAL SHOULDER ARTHROPLASTY Left 12/21/2021   Procedure: TOTAL SHOULDER ARTHROPLASTY;  Surgeon: Christena FlakePoggi, John J, MD;  Location: [facility];  Service: Orthopedics;  Laterality: Left;    Medical History: Past Medical History:  Diagnosis Date   ADHD (attention deficit hyperactivity disorder)    Arthritis    Basal cell carcinoma 02/15/2021   [facility]   Family history of adverse  reaction to anesthesia    mom-hard time waking up   Seizure    only in her 20's due to medication    Family History: History reviewed. No pertinent family history.  Social History   Socioeconomic History   Marital status: Married    Spouse name: Not on file   Number of children: Not on file   Years of education: Not on file   Highest education level: Not on file  Occupational History   Not on file  Tobacco Use   Smoking status: Never   Smokeless tobacco: Never  Vaping Use   Vaping Use: Never used  Substance and Sexual Activity   Alcohol use: Yes    Comment: glass of wine a month   Drug use: Never   Sexual activity: Yes  Other Topics Concern   Not on file  Social History Narrative   Not on file   Social Determinants of Health   Financial Resource Strain: Not on file  Food Insecurity: Not on file  Transportation Needs: Not on file  Physical Activity: Not on file  Stress: Not on file  Social Connections: Not on file  Intimate Partner Violence: Not on file      Review of Systems  Constitutional:  Positive for fatigue and unexpected weight change. Negative for chills.  HENT:  Negative for congestion, rhinorrhea, sneezing and sore throat.   Eyes:  Negative for redness.  Respiratory: Negative.  Negative for cough, chest tightness and shortness of breath.  Cardiovascular: Negative.  Negative for chest pain and palpitations.  Gastrointestinal:  Negative for abdominal pain, constipation, diarrhea, nausea and vomiting.  Genitourinary:  Negative for dysuria and frequency.  Musculoskeletal:  Negative for arthralgias, back pain, joint swelling and neck pain.  Skin:  Negative for rash.       Dry skin and brittle nails  Neurological: Negative.  Negative for tremors and numbness.  Hematological:  Negative for adenopathy. Does not bruise/bleed easily.  Psychiatric/Behavioral:  Negative for behavioral problems (Depression), self-injury, sleep disturbance and suicidal ideas.  The patient is not nervous/anxious.     Vital Signs: BP 119/83   Pulse 65   Temp 98.4 F (36.9 C)   Resp 16   Ht 5' (1.524 m)   Wt 126 lb 3.2 oz (57.2 kg)   SpO2 98%   BMI 24.65 kg/m    Physical Exam Vitals reviewed.  Constitutional:      General: She is not in acute distress.    Appearance: Normal appearance. She is normal weight. She is not ill-appearing.  Eyes:     Pupils: Pupils are equal, round, and reactive to light.  Cardiovascular:     Rate and Rhythm: Normal rate and regular rhythm.  Pulmonary:     Effort: Pulmonary effort is normal. No respiratory distress.  Neurological:     Mental Status: She is alert and oriented to person, place, and time.  Psychiatric:        Mood and Affect: Mood normal.        Behavior: Behavior normal.        Assessment/Plan: 1. Other specified hypothyroidism Levothyroxine dose increased 50 mcg daily. Repeat thyroid labs in 6 weeks.  - levothyroxine (SYNTHROID) 50 MCG tablet; Take 1 tablet (50 mcg total) by mouth daily. On empty stomach  Dispense: 90 tablet; Refill: 3 - TSH + free T4   General Counseling: Maria Donaldson verbalizes understanding of the findings of todays visit and agrees with plan of treatment. I have discussed any further diagnostic evaluation that may be needed or ordered today. We also reviewed her medications today. she has been encouraged to call the office with any questions or concerns that should arise related to todays visit.    Orders Placed This Encounter  Procedures   TSH + free T4    Meds ordered this encounter  Medications   levothyroxine (SYNTHROID) 50 MCG tablet    Sig: Take 1 tablet (50 mcg total) by mouth daily. On empty stomach    Dispense:  90 tablet    Refill:  3    Discotninue 25 mcg dose, fill new script now.    Return if symptoms worsen or fail to improve, for will call if visit needed related to thyroid.   Total time spent:20 Minutes Time spent includes review of chart, medications,  test results, and follow up plan with the patient.   [place] Controlled Substance Database was reviewed by me.  This patient was seen by Maria Kuster, FNP-C in collaboration with Dr. Beverely Risen as a part of collaborative care agreement.   Hye Trawick R. Tedd Sias, MSN, FNP-C Internal medicine

## 2023-01-19 ENCOUNTER — Encounter: Payer: Self-pay | Admitting: Nurse Practitioner

## 2023-03-20 LAB — TSH+FREE T4
Free T4: 1.21 ng/dL (ref 0.82–1.77)
TSH: 1.87 u[IU]/mL (ref 0.450–4.500)

## 2023-03-22 NOTE — Progress Notes (Signed)
Will discuss at upcoming office visit

## 2023-04-03 ENCOUNTER — Ambulatory Visit (INDEPENDENT_AMBULATORY_CARE_PROVIDER_SITE_OTHER): Payer: BC Managed Care – PPO | Admitting: Nurse Practitioner

## 2023-04-03 ENCOUNTER — Encounter: Payer: Self-pay | Admitting: Nurse Practitioner

## 2023-04-03 VITALS — BP 122/70 | HR 60 | Temp 98.3°F | Resp 16 | Ht 60.0 in | Wt 121.0 lb

## 2023-04-03 DIAGNOSIS — F321 Major depressive disorder, single episode, moderate: Secondary | ICD-10-CM | POA: Diagnosis not present

## 2023-04-03 DIAGNOSIS — E038 Other specified hypothyroidism: Secondary | ICD-10-CM

## 2023-04-03 DIAGNOSIS — Z23 Encounter for immunization: Secondary | ICD-10-CM

## 2023-04-03 MED ORDER — VENLAFAXINE HCL ER 37.5 MG PO CP24: 37.5000 mg | ORAL_CAPSULE | Freq: Every day | ORAL | 2 refills | Status: DC

## 2023-04-03 MED ORDER — TETANUS-DIPHTH-ACELL PERTUSSIS 5-2.5-18.5 LF-MCG/0.5 IM SUSP: 0.5000 mL | Freq: Once | INTRAMUSCULAR | 0 refills | Status: AC

## 2023-04-03 NOTE — Progress Notes (Signed)
[facility] [address] [place], [place] [address]  Internal MEDICINE  Office Visit Note  Patient Name: Maria Donaldson  [id number]  [id number]  Date of Service: 04/03/2023  Chief Complaint  Patient presents with   Follow-up    Review labs    HPI Gathel presents for a follow-up visit for hypothyroidism and depression Hypothyroidism -- labs are normal  Reporting could care less what she does, does not want to get up or go out, very low energy. --anhedonia, hypersomnia, increased appetite.  Fatigue as a product of depression    Current Medication: Outpatient Encounter Medications as of 04/03/2023  Medication Sig   amphetamine-dextroamphetamine (ADDERALL) 20 MG tablet Take 20 mg by mouth See admin instructions. Take 1 tablet (20 mg) by mouth scheduled in the morning, may take an additional dose in the evening if needed for focus   levothyroxine (SYNTHROID) 50 MCG tablet Take 1 tablet (50 mcg total) by mouth daily. On empty stomach   PREMPRO 0.625-2.5 MG tablet Take 1 tablet by mouth daily.   venlafaxine XR (EFFEXOR XR) 37.5 MG 24 hr capsule Take 1 capsule (37.5 mg total) by mouth daily with breakfast.   [DISCONTINUED] sertraline (ZOLOFT) 100 MG tablet Take 1 tablet by mouth daily.   [DISCONTINUED] Tdap (BOOSTRIX) 5-2.5-18.5 LF-MCG/0.5 injection Inject 0.5 mLs into the muscle once.   Tdap (BOOSTRIX) 5-2.5-18.5 LF-MCG/0.5 injection Inject 0.5 mLs into the muscle once for 1 dose.   No facility-administered encounter medications on file as of 04/03/2023.    Surgical History: Past Surgical History:  Procedure Laterality Date   BREAST SURGERY Bilateral    augmentation   HAND SURGERY Left    fused joint on first finger   TOTAL HIP ARTHROPLASTY Right 10/06/2019   Procedure: TOTAL HIP ARTHROPLASTY;  Surgeon: Christena Flake, MD;  Location: [facility];  Service: Orthopedics;  Laterality: Right;   TOTAL SHOULDER ARTHROPLASTY Left 12/21/2021   Procedure: TOTAL SHOULDER ARTHROPLASTY;   Surgeon: Christena Flake, MD;  Location: [facility] OR[facility];  Service: Orthopedics;  Laterality: Left;    Medical History: Past Medical History:  Diagnosis Date   ADHD (attention deficit hyperactivity disorder)    Arthritis    Basal cell carcinoma 02/15/2021   R glabella, [facility]OHs, [facility] 03/14/21   Family history of adverse reaction to anesthesia    mom-hard time waking up   Seizure [facility])    only in her 20's due to medication    Family History: History reviewed. No pertinent family history.  Social History   Socioeconomic History   Marital status: Married    Spouse name: Not on file   Number of children: Not on file   Years of education: Not on file   Highest education level: Not on file  Occupational History   Not on file  Tobacco Use   Smoking status: Never   Smokeless tobacco: Never  Vaping Use   Vaping Use: Never used  Substance and Sexual Activity   Alcohol use: Yes    Comment: glass of wine a month   Drug use: Never   Sexual activity: Yes  Other Topics Concern   Not on file  Social History Narrative   Not on file   Social Determinants of Health   Financial Resource Strain: Not on file  Food Insecurity: Not on file  Transportation Needs: Not on file  Physical Activity: Not on file  Stress: Not on file  Social Connections: Not on file  Intimate Partner Violence: Not  on file      Review of Systems  Constitutional:  Positive for appetite change, fatigue and unexpected weight change.  Respiratory: Negative.  Negative for cough, chest tightness, shortness of breath and wheezing.   Cardiovascular: Negative.  Negative for chest pain and palpitations.  Gastrointestinal: Negative.   Musculoskeletal: Negative.   Psychiatric/Behavioral:  Positive for behavioral problems and sleep disturbance. Negative for self-injury and suicidal ideas. The patient is nervous/anxious.     Vital Signs: BP 122/70   Pulse 60   Temp 98.3 F (36.8 C)   Resp 16    Ht 5' (1.524 m)   Wt 121 lb (54.9 kg)   SpO2 96%   BMI 23.63 kg/m    Physical Exam Vitals reviewed.  Constitutional:      General: She is not in acute distress.    Appearance: Normal appearance. She is normal weight. She is not ill-appearing.  HENT:     Head: Normocephalic and atraumatic.  Eyes:     Pupils: Pupils are equal, round, and reactive to light.  Cardiovascular:     Rate and Rhythm: Normal rate and regular rhythm.  Pulmonary:     Effort: Pulmonary effort is normal. No respiratory distress.  Neurological:     Mental Status: She is alert and oriented to person, place, and time.  Psychiatric:        Mood and Affect: Mood normal.        Behavior: Behavior normal.        Assessment/Plan: 1. Other specified hypothyroidism Continue current dose of levothyroxine as prescribed.   2. Need for vaccination - Tdap (BOOSTRIX) 5-2.5-18.5 LF-MCG/0.5 injection; Inject 0.5 mLs into the muscle once for 1 dose.  Dispense: 0.5 mL; Refill: 0  3. Depression, major, single episode, moderate (HCC) Stop sertraline. Start venlafaxine as prescribed.  - venlafaxine XR (EFFEXOR XR) 37.5 MG 24 hr capsule; Take 1 capsule (37.5 mg total) by mouth daily with breakfast.  Dispense: 30 capsule; Refill: 2   General Counseling: leontina terhorst understanding of the findings of todays visit and agrees with plan of treatment. I have discussed any further diagnostic evaluation that may be needed or ordered today. We also reviewed her medications today. she has been encouraged to call the office with any questions or concerns that should arise related to todays visit.    No orders of the defined types were placed in this encounter.   Meds ordered this encounter  Medications   Tdap (BOOSTRIX) 5-2.5-18.5 LF-MCG/0.5 injection    Sig: Inject 0.5 mLs into the muscle once for 1 dose.    Dispense:  0.5 mL    Refill:  0   venlafaxine XR (EFFEXOR XR) 37.5 MG 24 hr capsule    Sig: Take 1 capsule (37.5  mg total) by mouth daily with breakfast.    Dispense:  30 capsule    Refill:  2    Return in about 4 weeks (around 05/01/2023) for F/U, eval new med, Aarion Metzgar PCP.   Total time spent:30 Minutes Time spent includes review of chart, medications, test results, and follow up plan with the patient.   [place] Controlled Substance Database was reviewed by me.  This patient was seen by Sallyanne Kuster, FNP-C in collaboration with Dr. Beverely Risen as a part of collaborative care agreement.   Charniece Venturino R. Tedd Sias, MSN, FNP-C Internal medicine

## 2023-05-01 ENCOUNTER — Ambulatory Visit (INDEPENDENT_AMBULATORY_CARE_PROVIDER_SITE_OTHER): Payer: BC Managed Care – PPO | Admitting: Nurse Practitioner

## 2023-05-01 ENCOUNTER — Other Ambulatory Visit: Payer: Self-pay | Admitting: Nurse Practitioner

## 2023-05-01 ENCOUNTER — Encounter: Payer: Self-pay | Admitting: Nurse Practitioner

## 2023-05-01 VITALS — BP 118/76 | HR 87 | Temp 98.2°F | Resp 16 | Ht 60.0 in | Wt 119.8 lb

## 2023-05-01 DIAGNOSIS — R5383 Other fatigue: Secondary | ICD-10-CM | POA: Diagnosis not present

## 2023-05-01 DIAGNOSIS — F321 Major depressive disorder, single episode, moderate: Secondary | ICD-10-CM

## 2023-05-01 MED ORDER — AUVELITY 45-105 MG PO TBCR: 1.0000 | EXTENDED_RELEASE_TABLET | Freq: Two times a day (BID) | ORAL | 5 refills | Status: DC

## 2023-05-01 NOTE — Progress Notes (Signed)
[facility] [address] [place], [place] [address]  Internal MEDICINE  Office Visit Note  Patient Name: Maria Donaldson  [id number]  [id number]  Date of Service: 05/01/2023  Chief Complaint  Patient presents with   Follow-up    Eval new med.     HPI Dorothymae presents for a follow-up visit for depression and fatigue Fatigue -- symptom of her depression, no significantly better with venlafaxine.  No significant improvement in symptoms on venlafaxine. Still experiencing anhedonia, hypersomnia, depressed mood, no energy, fatigue. Would like to try something different.     Current Medication: Outpatient Encounter Medications as of 05/01/2023  Medication Sig   amphetamine-dextroamphetamine (ADDERALL) 20 MG tablet Take 20 mg by mouth See admin instructions. Take 1 tablet (20 mg) by mouth scheduled in the morning, may take an additional dose in the evening if needed for focus   Dextromethorphan-buPROPion ER (AUVELITY) 45-105 MG TBCR Take 1 tablet by mouth in the morning and at bedtime.   levothyroxine (SYNTHROID) 50 MCG tablet Take 1 tablet (50 mcg total) by mouth daily. On empty stomach   PREMPRO 0.625-2.5 MG tablet Take 1 tablet by mouth daily.   venlafaxine XR (EFFEXOR XR) 37.5 MG 24 hr capsule Take 1 capsule (37.5 mg total) by mouth daily with breakfast.   No facility-administered encounter medications on file as of 05/01/2023.    Surgical History: Past Surgical History:  Procedure Laterality Date   BREAST SURGERY Bilateral    augmentation   HAND SURGERY Left    fused joint on first finger   TOTAL HIP ARTHROPLASTY Right 10/06/2019   Procedure: TOTAL HIP ARTHROPLASTY;  Surgeon: Christena Flake, MD;  Location: [facility] ORS;  Service: Orthopedics;  Laterality: Right;   TOTAL SHOULDER ARTHROPLASTY Left 12/21/2021   Procedure: TOTAL SHOULDER ARTHROPLASTY;  Surgeon: Christena Flake, MD;  Location: [facility] ORS;  Service: Orthopedics;  Laterality: Left;    Medical History: Past Medical  History:  Diagnosis Date   ADHD (attention deficit hyperactivity disorder)    Arthritis    Basal cell carcinoma 02/15/2021   R glabella, MOH[facility], [facility] 03/14/21   Family history of adverse reaction to anesthesia    mom-hard time waking up   Seizure [facility])    only in her 20's due to medication    Family History: History reviewed. No pertinent family history.  Social History   Socioeconomic History   Marital status: Married    Spouse name: Not on file   Number of children: Not on file   Years of education: Not on file   Highest education level: Not on file  Occupational History   Not on file  Tobacco Use   Smoking status: Never   Smokeless tobacco: Never  Vaping Use   Vaping status: Never Used  Substance and Sexual Activity   Alcohol use: Yes    Comment: glass of wine a month   Drug use: Never   Sexual activity: Yes  Other Topics Concern   Not on file  Social History Narrative   Not on file   Social Determinants of Health   Financial Resource Strain: Not on file  Food Insecurity: Not on file  Transportation Needs: Not on file  Physical Activity: Not on file  Stress: Not on file  Social Connections: Not on file  Intimate Partner Violence: Not on file      Review of Systems  Constitutional:  Positive for appetite change, fatigue and unexpected weight change.  Respiratory: Negative.  Negative for cough, chest tightness, shortness of breath and wheezing.   Cardiovascular: Negative.  Negative for chest pain and palpitations.  Gastrointestinal: Negative.   Musculoskeletal: Negative.   Psychiatric/Behavioral:  Positive for behavioral problems and sleep disturbance. Negative for self-injury and suicidal ideas. The patient is nervous/anxious.     Vital Signs: BP 118/76   Pulse 87   Temp 98.2 F (36.8 C)   Resp 16   Ht 5' (1.524 m)   Wt 119 lb 12.8 oz (54.3 kg)   SpO2 97%   BMI 23.40 kg/m    Physical Exam Vitals reviewed.   Constitutional:      General: She is not in acute distress.    Appearance: Normal appearance. She is normal weight. She is not ill-appearing.  HENT:     Head: Normocephalic and atraumatic.  Eyes:     Pupils: Pupils are equal, round, and reactive to light.  Cardiovascular:     Rate and Rhythm: Normal rate and regular rhythm.  Pulmonary:     Effort: Pulmonary effort is normal. No respiratory distress.  Neurological:     Mental Status: She is alert and oriented to person, place, and time.  Psychiatric:        Attention and Perception: She is inattentive.        Mood and Affect: Mood is depressed.        Speech: Speech is delayed.        Behavior: Behavior is slowed and withdrawn. Behavior is cooperative.        Thought Content: Thought content is not paranoid or delusional. Thought content does not include homicidal or suicidal ideation.        Assessment/Plan: 1. Other fatigue Plan is to improve fatigue by treating her depressive symptoms with auvelity, will reassess in 4 weeks.   2. Depression, major, single episode, moderate (HCC) Will Try auvelity after discussing medication with patient, sample given to patient, prescription sent to [facility]. Follow up in 4 weeks.  - Dextromethorphan-buPROPion ER (AUVELITY) 45-105 MG TBCR; Take 1 tablet by mouth in the morning and at bedtime.  Dispense: 60 tablet; Refill: 5   General Counseling: willoe kerwick understanding of the findings of todays visit and agrees with plan of treatment. I have discussed any further diagnostic evaluation that may be needed or ordered today. We also reviewed her medications today. she has been encouraged to call the office with any questions or concerns that should arise related to todays visit.    No orders of the defined types were placed in this encounter.   Meds ordered this encounter  Medications   Dextromethorphan-buPROPion ER (AUVELITY) 45-105 MG TBCR    Sig: Take 1 tablet by mouth in the  morning and at bedtime.    Dispense:  60 tablet    Refill:  5    Return in about 4 weeks (around 05/29/2023) for F/U, eval new med,  PCP.   Total time spent:20 Minutes Time spent includes review of chart, medications, test results, and follow up plan with the patient.   [place] Controlled Substance Database was reviewed by me.  This patient was seen by Sallyanne Kuster, FNP-C in collaboration with Dr. Beverely Risen as a part of collaborative care agreement.    R. Tedd Sias, MSN, FNP-C Internal medicine

## 2023-05-02 ENCOUNTER — Other Ambulatory Visit: Payer: Self-pay | Admitting: Nurse Practitioner

## 2023-05-02 DIAGNOSIS — N951 Menopausal and female climacteric states: Secondary | ICD-10-CM

## 2023-05-03 ENCOUNTER — Other Ambulatory Visit: Payer: Self-pay | Admitting: Nurse Practitioner

## 2023-05-03 DIAGNOSIS — F321 Major depressive disorder, single episode, moderate: Secondary | ICD-10-CM

## 2023-05-03 NOTE — Telephone Encounter (Signed)
Ok to send 90 days 

## 2023-05-07 ENCOUNTER — Telehealth: Payer: Self-pay

## 2023-05-07 NOTE — Telephone Encounter (Signed)
Left message for patient to give office a call back to let her know her PA was approved for Auvelity.

## 2023-05-11 ENCOUNTER — Encounter: Payer: Self-pay | Admitting: Nurse Practitioner

## 2023-05-29 ENCOUNTER — Encounter: Payer: Self-pay | Admitting: Nurse Practitioner

## 2023-05-29 ENCOUNTER — Ambulatory Visit (INDEPENDENT_AMBULATORY_CARE_PROVIDER_SITE_OTHER): Payer: BC Managed Care – PPO | Admitting: Nurse Practitioner

## 2023-05-29 VITALS — BP 120/70 | HR 50 | Temp 98.3°F | Resp 16 | Ht 60.0 in | Wt 116.8 lb

## 2023-05-29 DIAGNOSIS — F321 Major depressive disorder, single episode, moderate: Secondary | ICD-10-CM

## 2023-05-29 DIAGNOSIS — R5383 Other fatigue: Secondary | ICD-10-CM

## 2023-05-29 MED ORDER — ESCITALOPRAM OXALATE 5 MG PO TABS
5.0000 mg | ORAL_TABLET | Freq: Every day | ORAL | 3 refills | Status: DC
Start: 2023-05-29 — End: 2023-06-26

## 2023-05-29 NOTE — Progress Notes (Signed)
[facility] [address] [place], [place] [address]  Internal MEDICINE  Office Visit Note  Patient Name: Maria Donaldson  [id number]  [id number]  Date of Service: 05/29/2023  Chief Complaint  Patient presents with   Follow-up    Eval new med.     HPI Maria Donaldson presents for a follow-up visit for depression and fatigue.  Depression -- auvelity caused side effects of brain fog, dizziness, tired and sluggish. So not the right medication. Will wean off and try something else per patient  Fatigue -- made worse by depressive symptoms.     Current Medication: Outpatient Encounter Medications as of 05/29/2023  Medication Sig   amphetamine-dextroamphetamine (ADDERALL) 20 MG tablet Take 20 mg by mouth See admin instructions. Take 1 tablet (20 mg) by mouth scheduled in the morning, may take an additional dose in the evening if needed for focus   escitalopram (LEXAPRO) 5 MG tablet Take 1 tablet (5 mg total) by mouth daily.   levothyroxine (SYNTHROID) 50 MCG tablet Take 1 tablet (50 mcg total) by mouth daily. On empty stomach   PREMPRO 0.625-2.5 MG tablet TAKE 1 TABLET BY MOUTH EVERY DAY   [DISCONTINUED] Dextromethorphan-buPROPion ER (AUVELITY) 45-105 MG TBCR Take 1 tablet by mouth in the morning and at bedtime.   [DISCONTINUED] venlafaxine XR (EFFEXOR XR) 37.5 MG 24 hr capsule Take 1 capsule (37.5 mg total) by mouth daily with breakfast.   No facility-administered encounter medications on file as of 05/29/2023.    Surgical History: Past Surgical History:  Procedure Laterality Date   BREAST SURGERY Bilateral    augmentation   HAND SURGERY Left    fused joint on first finger   TOTAL HIP ARTHROPLASTY Right 10/06/2019   Procedure: TOTAL HIP ARTHROPLASTY;  Surgeon: Christena Flake, MD;  Location: [facility] ORS;  Service: Orthopedics;  Laterality: Right;   TOTAL SHOULDER ARTHROPLASTY Left 12/21/2021   Procedure: TOTAL SHOULDER ARTHROPLASTY;  Surgeon: Christena Flake, MD;  Location: [facility] ORS;   Service: Orthopedics;  Laterality: Left;    Medical History: Past Medical History:  Diagnosis Date   ADHD (attention deficit hyperactivity disorder)    Arthritis    Basal cell carcinoma 02/15/2021   R glabella, MOHs, [facility] 03/14/21   Family history of adverse reaction to anesthesia    mom-hard time waking up   Seizure [facility])    only in her 20's due to medication    Family History: History reviewed. No pertinent family history.  Social History   Socioeconomic History   Marital status: Married    Spouse name: Not on file   Number of children: Not on file   Years of education: Not on file   Highest education level: Not on file  Occupational History   Not on file  Tobacco Use   Smoking status: Never   Smokeless tobacco: Never  Vaping Use   Vaping status: Never Used  Substance and Sexual Activity   Alcohol use: Yes    Comment: glass of wine a month   Drug use: Never   Sexual activity: Yes  Other Topics Concern   Not on file  Social History Narrative   Not on file   Social Determinants of Health   Financial Resource Strain: Not on file  Food Insecurity: Not on file  Transportation Needs: Not on file  Physical Activity: Not on file  Stress: Not on file  Social Connections: Not on file  Intimate Partner Violence: Not on file  Review of Systems  Constitutional:  Positive for appetite change, fatigue and unexpected weight change.  Respiratory: Negative.  Negative for cough, chest tightness, shortness of breath and wheezing.   Cardiovascular: Negative.  Negative for chest pain and palpitations.  Gastrointestinal: Negative.   Musculoskeletal: Negative.   Neurological:  Positive for dizziness.  Psychiatric/Behavioral:  Positive for behavioral problems and sleep disturbance. Negative for self-injury and suicidal ideas. The patient is nervous/anxious.     Vital Signs: BP 120/70   Pulse (!) 50   Temp 98.3 F (36.8 C)   Resp 16   Ht  5' (1.524 m)   Wt 116 lb 12.8 oz (53 kg)   SpO2 99%   BMI 22.81 kg/m    Physical Exam Vitals reviewed.  Constitutional:      General: She is not in acute distress.    Appearance: Normal appearance. She is normal weight. She is not ill-appearing.  HENT:     Head: Normocephalic and atraumatic.  Eyes:     Pupils: Pupils are equal, round, and reactive to light.  Cardiovascular:     Rate and Rhythm: Normal rate and regular rhythm.  Pulmonary:     Effort: Pulmonary effort is normal. No respiratory distress.  Neurological:     Mental Status: She is alert and oriented to person, place, and time.  Psychiatric:        Attention and Perception: She is inattentive.        Mood and Affect: Mood is depressed.        Speech: Speech is delayed.        Behavior: Behavior is slowed and withdrawn. Behavior is cooperative.        Thought Content: Thought content is not paranoid or delusional. Thought content does not include homicidal or suicidal ideation.        Assessment/Plan: 1. Other fatigue As a symptom of her depression   2. Depression, major, single episode, moderate (HCC) Referred to psychiatry. Discontinue auvelity. Try lexapro 5 mg daily. Follow up in 4 weeks.  - Ambulatory referral to Psychiatry - escitalopram (LEXAPRO) 5 MG tablet; Take 1 tablet (5 mg total) by mouth daily.  Dispense: 30 tablet; Refill: 3   General Counseling: Maria Donaldson verbalizes understanding of the findings of todays visit and agrees with plan of treatment. I have discussed any further diagnostic evaluation that may be needed or ordered today. We also reviewed her medications today. she has been encouraged to call the office with any questions or concerns that should arise related to todays visit.    Orders Placed This Encounter  Procedures   Ambulatory referral to Psychiatry    Meds ordered this encounter  Medications   escitalopram (LEXAPRO) 5 MG tablet    Sig: Take 1 tablet (5 mg total) by mouth  daily.    Dispense:  30 tablet    Refill:  3    Return in about 4 weeks (around 06/26/2023) for F/U, eval new med, Maria Donaldson PCP.   Total time spent:30 Minutes Time spent includes review of chart, medications, test results, and follow up plan with the patient.   [place] Controlled Substance Database was reviewed by me.  This patient was seen by Sallyanne Kuster, FNP-C in collaboration with Dr. Beverely Risen as a part of collaborative care agreement.   Mohid Furuya R. Tedd Sias, MSN, FNP-C Internal medicine

## 2023-05-30 ENCOUNTER — Telehealth: Payer: Self-pay | Admitting: Nurse Practitioner

## 2023-05-30 NOTE — Telephone Encounter (Signed)
Awaiting 05/29/23 office notes for [facility] referral-Toni

## 2023-06-20 ENCOUNTER — Other Ambulatory Visit: Payer: Self-pay | Admitting: Nurse Practitioner

## 2023-06-20 DIAGNOSIS — F321 Major depressive disorder, single episode, moderate: Secondary | ICD-10-CM

## 2023-06-20 NOTE — Telephone Encounter (Signed)
Last 8/24

## 2023-06-23 ENCOUNTER — Encounter: Payer: Self-pay | Admitting: Nurse Practitioner

## 2023-06-24 ENCOUNTER — Telehealth: Payer: Self-pay | Admitting: Nurse Practitioner

## 2023-06-24 NOTE — Telephone Encounter (Signed)
[facility] referral faxed to Dr. Maryruth Bun; [phone]. Notified patient. Gave pt telephone # [phone]

## 2023-06-26 ENCOUNTER — Ambulatory Visit: Payer: BC Managed Care – PPO | Admitting: Nurse Practitioner

## 2023-06-26 ENCOUNTER — Encounter: Payer: Self-pay | Admitting: Nurse Practitioner

## 2023-06-26 DIAGNOSIS — F321 Major depressive disorder, single episode, moderate: Secondary | ICD-10-CM

## 2023-06-26 MED ORDER — ESCITALOPRAM OXALATE 5 MG PO TABS
5.0000 mg | ORAL_TABLET | Freq: Every day | ORAL | 1 refills | Status: DC
Start: 2023-06-26 — End: 2023-11-25

## 2023-06-26 NOTE — Progress Notes (Signed)
[facility] [address] [place], [place] [address]  Internal MEDICINE  Office Visit Note  Patient Name: Maria Donaldson  [id number]  [id number]  Date of Service: 06/26/2023  Chief Complaint  Patient presents with   Follow-up    Eval new med.     HPI Maria Donaldson presents for a follow-up visit for depression and anxiety  Doing well with new medication, wants to stay on this medication now.  Increased appetite some Insomnia briefly but this has improved.  No change in dose, patient states that the current dose is good.      Current Medication: Outpatient Encounter Medications as of 06/26/2023  Medication Sig   amphetamine-dextroamphetamine (ADDERALL) 20 MG tablet Take 20 mg by mouth See admin instructions. Take 1 tablet (20 mg) by mouth scheduled in the morning, may take an additional dose in the evening if needed for focus   levothyroxine (SYNTHROID) 50 MCG tablet Take 1 tablet (50 mcg total) by mouth daily. On empty stomach   PREMPRO 0.625-2.5 MG tablet TAKE 1 TABLET BY MOUTH EVERY DAY   [DISCONTINUED] escitalopram (LEXAPRO) 5 MG tablet Take 1 tablet (5 mg total) by mouth daily.   escitalopram (LEXAPRO) 5 MG tablet Take 1 tablet (5 mg total) by mouth daily.   No facility-administered encounter medications on file as of 06/26/2023.    Surgical History: Past Surgical History:  Procedure Laterality Date   BREAST SURGERY Bilateral    augmentation   HAND SURGERY Left    fused joint on first finger   TOTAL HIP ARTHROPLASTY Right 10/06/2019   Procedure: TOTAL HIP ARTHROPLASTY;  Surgeon: Christena Flake, MD;  Location: [facility] ORS;  Service: Orthopedics;  Laterality: Right;   TOTAL SHOULDER ARTHROPLASTY Left 12/21/2021   Procedure: TOTAL SHOULDER ARTHROPLASTY;  Surgeon: Christena Flake, MD;  Location: [facility] ORS;  Service: Orthopedics;  Laterality: Left;    Medical History: Past Medical History:  Diagnosis Date   ADHD (attention deficit hyperactivity disorder)    Arthritis     Basal cell carcinoma 02/15/2021   R glabella, MOHs, [facility] 03/14/21   Family history of adverse reaction to anesthesia    mom-hard time waking up   Seizure [facility])    only in her 20's due to medication    Family History: History reviewed. No pertinent family history.  Social History   Socioeconomic History   Marital status: Married    Spouse name: Not on file   Number of children: Not on file   Years of education: Not on file   Highest education level: Not on file  Occupational History   Not on file  Tobacco Use   Smoking status: Never   Smokeless tobacco: Never  Vaping Use   Vaping status: Never Used  Substance and Sexual Activity   Alcohol use: Yes    Comment: glass of wine a month   Drug use: Never   Sexual activity: Yes  Other Topics Concern   Not on file  Social History Narrative   Not on file   Social Determinants of Health   Financial Resource Strain: Not on file  Food Insecurity: Not on file  Transportation Needs: Not on file  Physical Activity: Not on file  Stress: Not on file  Social Connections: Not on file  Intimate Partner Violence: Not on file      Review of Systems  Constitutional:  Positive for appetite change (improving), fatigue and unexpected weight change.  Respiratory: Negative.  Negative for cough,  chest tightness, shortness of breath and wheezing.   Cardiovascular: Negative.  Negative for chest pain and palpitations.  Gastrointestinal: Negative.   Musculoskeletal: Negative.   Neurological:  Positive for dizziness.  Psychiatric/Behavioral:  Positive for behavioral problems (improving) and sleep disturbance. Negative for self-injury and suicidal ideas. The patient is nervous/anxious (improving).     Vital Signs: BP 110/68   Pulse 60   Temp 98.4 F (36.9 C)   Resp 16   Ht 5' (1.524 m)   Wt 123 lb (55.8 kg)   SpO2 97%   BMI 24.02 kg/m    Physical Exam Vitals reviewed.  Constitutional:      General:  She is not in acute distress.    Appearance: Normal appearance. She is normal weight. She is not ill-appearing.  HENT:     Head: Normocephalic and atraumatic.  Eyes:     Pupils: Pupils are equal, round, and reactive to light.  Cardiovascular:     Rate and Rhythm: Normal rate and regular rhythm.  Pulmonary:     Effort: Pulmonary effort is normal. No respiratory distress.  Neurological:     Mental Status: She is alert and oriented to person, place, and time.  Psychiatric:        Attention and Perception: She is inattentive.        Mood and Affect: Mood is depressed.        Speech: Speech is delayed.        Behavior: Behavior is slowed and withdrawn. Behavior is cooperative.        Thought Content: Thought content is not paranoid or delusional. Thought content does not include homicidal or suicidal ideation.     Comments: Symptoms are improving some per patient report        Assessment/Plan: 1. Depression, major, single episode, moderate (HCC) Continue lexapro at current dose, change to a 90 day supply and follow up with patient in 1-3 months.  - escitalopram (LEXAPRO) 5 MG tablet; Take 1 tablet (5 mg total) by mouth daily.  Dispense: 90 tablet; Refill: 1   General Counseling: Maria Donaldson understanding of the findings of todays visit and agrees with plan of treatment. I have discussed any further diagnostic evaluation that may be needed or ordered today. We also reviewed her medications today. she has been encouraged to call the office with any questions or concerns that should arise related to todays visit.    No orders of the defined types were placed in this encounter.   Meds ordered this encounter  Medications   escitalopram (LEXAPRO) 5 MG tablet    Sig: Take 1 tablet (5 mg total) by mouth daily.    Dispense:  90 tablet    Refill:  1    Please fill for 90 days.    Return for upcoming appt in february, encouraged patient to reach out sooner if needed .   Total  time spent:20 Minutes Time spent includes review of chart, medications, test results, and follow up plan with the patient.   [place] Controlled Substance Database was reviewed by me.  This patient was seen by Sallyanne Kuster, FNP-C in collaboration with Dr. Beverely Risen as a part of collaborative care agreement.   Maria Donaldson R. Tedd Sias, MSN, FNP-C Internal medicine

## 2023-08-17 ENCOUNTER — Encounter: Payer: Self-pay | Admitting: Nurse Practitioner

## 2023-10-25 IMAGING — DX DG SHOULDER 1V*L*
3 series · 3 of 3 positions shown · non-contrast
Comparison: None.

CLINICAL DATA: Status post left shoulder arthroplasty common biceps
tendinosis

EXAM:
LEFT SHOULDER

[shoulder ap (1 of 2)]
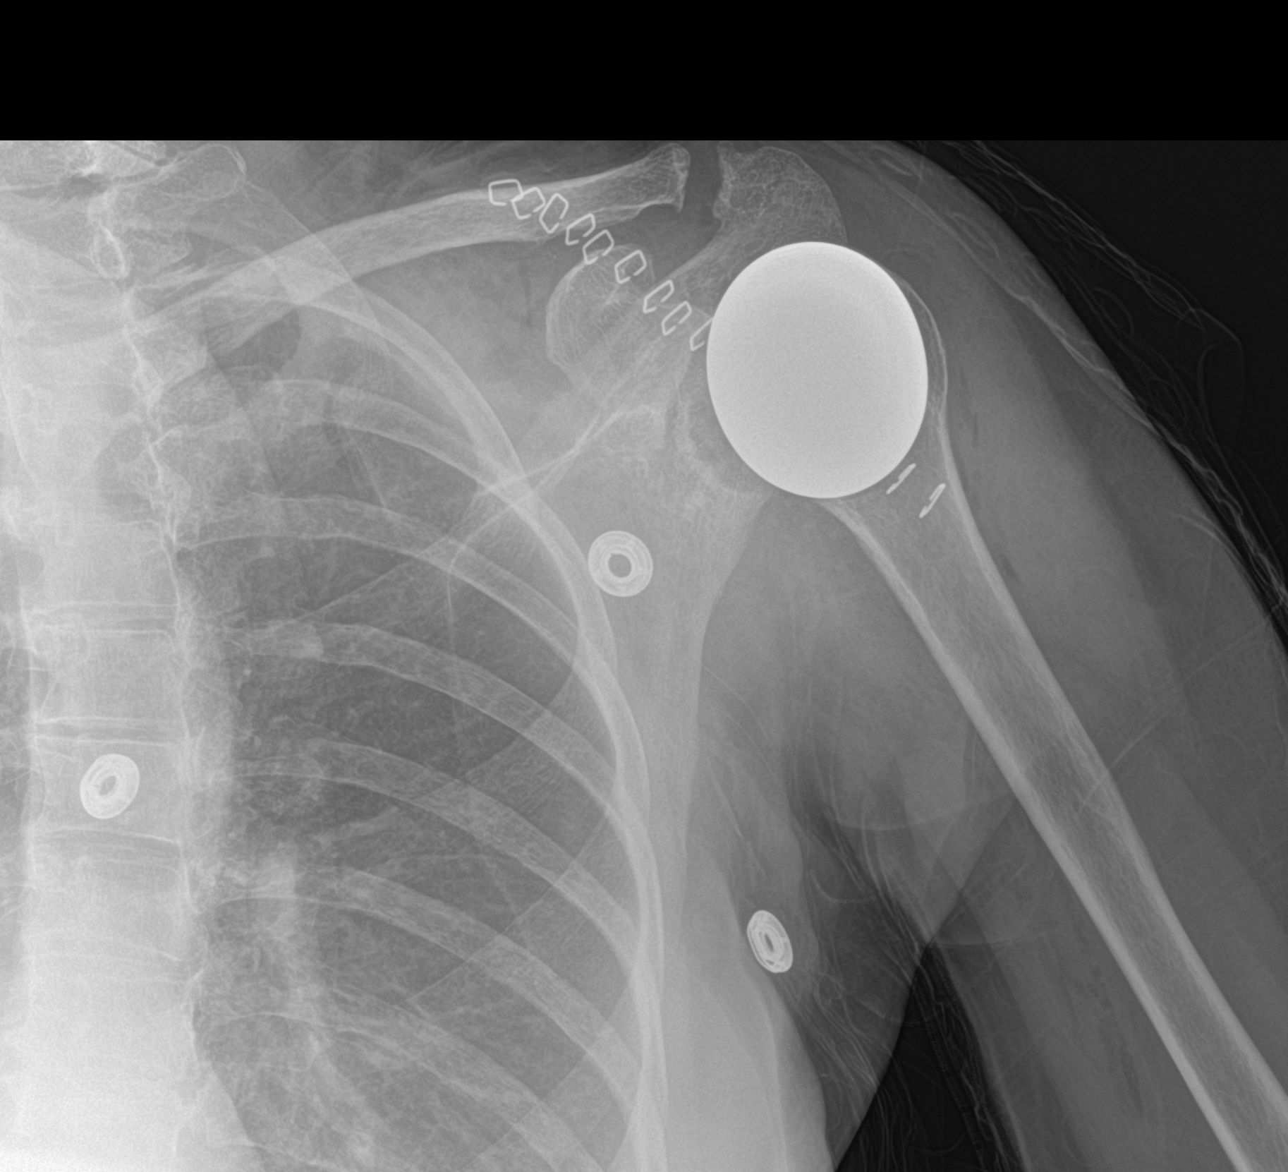

[shoulder obl]
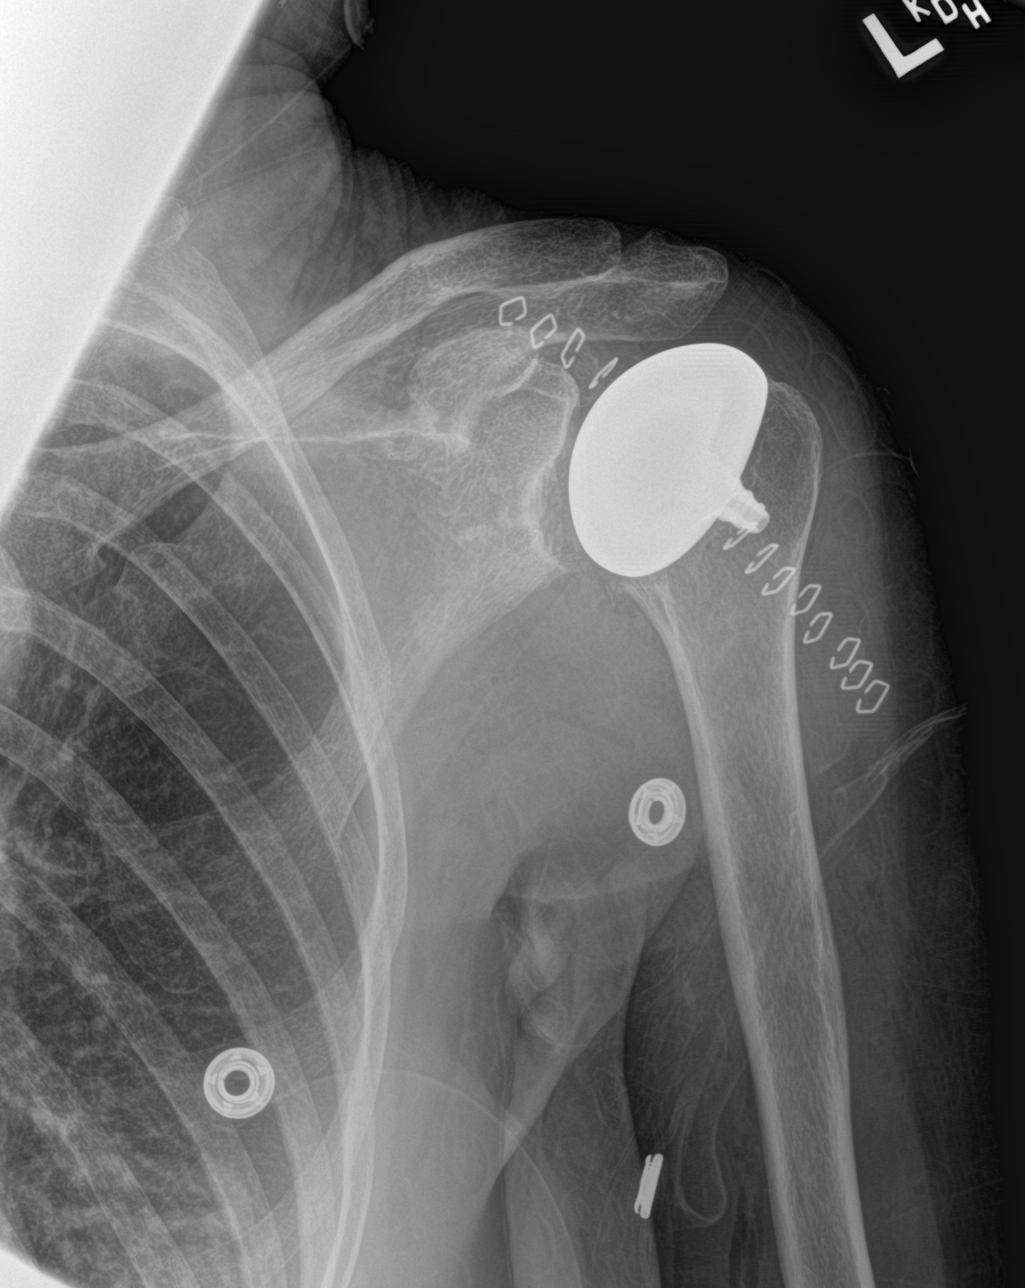

[shoulder ap (2 of 2)]
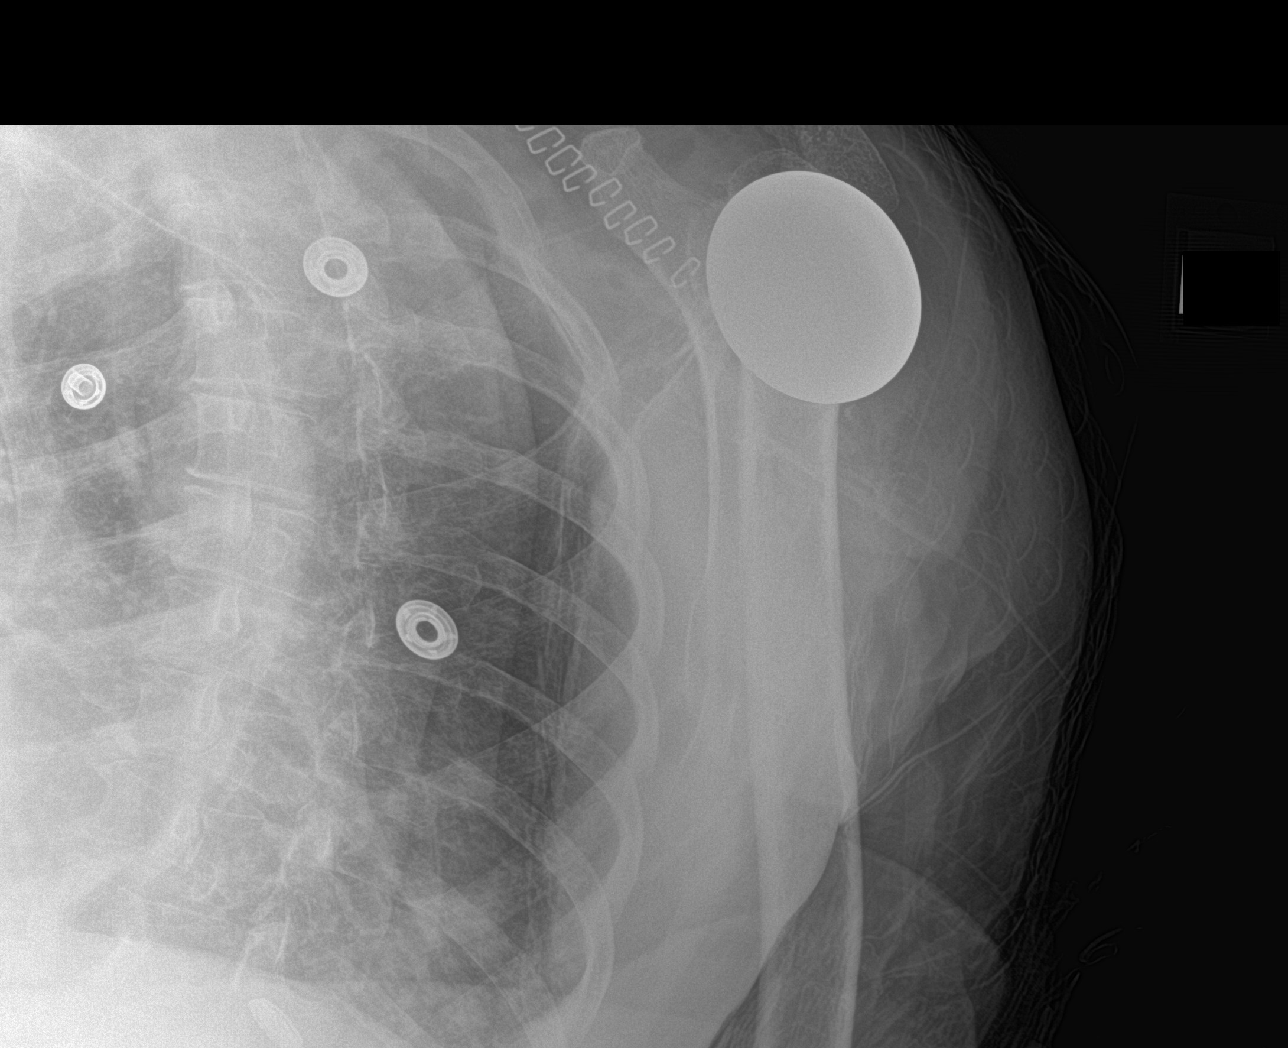

[3 of 3 positions shown; findings below may reference images not displayed]

FINDINGS: Postsurgical changes reflecting left shoulder arthroplasty are seen
without evidence of complication. Hardware alignment is within
expected limits. The AC joint is intact. Soft tissues are
unremarkable.
IMPRESSION: Status post left shoulder arthroplasty without evidence of
complication.

## 2023-11-25 ENCOUNTER — Ambulatory Visit (INDEPENDENT_AMBULATORY_CARE_PROVIDER_SITE_OTHER): Payer: 59 | Admitting: Nurse Practitioner

## 2023-11-25 ENCOUNTER — Encounter: Payer: Self-pay | Admitting: Nurse Practitioner

## 2023-11-25 VITALS — BP 114/70 | HR 60 | Temp 98.2°F | Resp 16 | Ht 60.0 in | Wt 123.6 lb

## 2023-11-25 DIAGNOSIS — N951 Menopausal and female climacteric states: Secondary | ICD-10-CM

## 2023-11-25 DIAGNOSIS — F325 Major depressive disorder, single episode, in full remission: Secondary | ICD-10-CM | POA: Diagnosis not present

## 2023-11-25 DIAGNOSIS — F900 Attention-deficit hyperactivity disorder, predominantly inattentive type: Secondary | ICD-10-CM

## 2023-11-25 DIAGNOSIS — E038 Other specified hypothyroidism: Secondary | ICD-10-CM

## 2023-11-25 DIAGNOSIS — Z0001 Encounter for general adult medical examination with abnormal findings: Secondary | ICD-10-CM | POA: Diagnosis not present

## 2023-11-25 DIAGNOSIS — F321 Major depressive disorder, single episode, moderate: Secondary | ICD-10-CM

## 2023-11-25 DIAGNOSIS — E782 Mixed hyperlipidemia: Secondary | ICD-10-CM

## 2023-11-25 MED ORDER — ESCITALOPRAM OXALATE 5 MG PO TABS
5.0000 mg | ORAL_TABLET | Freq: Every day | ORAL | 1 refills | Status: DC
Start: 2023-11-25 — End: 2024-03-02

## 2023-11-25 MED ORDER — AMPHETAMINE-DEXTROAMPHETAMINE 20 MG PO TABS
20.0000 mg | ORAL_TABLET | Freq: Every day | ORAL | 0 refills | Status: DC
Start: 2023-11-25 — End: 2024-03-02

## 2023-11-25 MED ORDER — AMPHETAMINE-DEXTROAMPHETAMINE 20 MG PO TABS
20.0000 mg | ORAL_TABLET | Freq: Every day | ORAL | 0 refills | Status: DC
Start: 2023-12-23 — End: 2024-03-02

## 2023-11-25 MED ORDER — AMPHETAMINE-DEXTROAMPHETAMINE 20 MG PO TABS
20.0000 mg | ORAL_TABLET | Freq: Every day | ORAL | 0 refills | Status: DC
Start: 2024-01-20 — End: 2024-03-02

## 2023-11-25 MED ORDER — LEVOTHYROXINE SODIUM 50 MCG PO TABS
50.0000 ug | ORAL_TABLET | Freq: Every day | ORAL | 3 refills | Status: AC
Start: 2023-11-25 — End: ?

## 2023-11-25 MED ORDER — PREMPRO 0.625-2.5 MG PO TABS
1.0000 | ORAL_TABLET | Freq: Every day | ORAL | 1 refills | Status: DC
Start: 2023-11-25 — End: 2024-03-02

## 2023-11-25 NOTE — Progress Notes (Signed)
 [facility] [address] [place], [place] [address]  Internal MEDICINE  Office Visit Note  Patient Name: Maria Donaldson  [id number]  [id number]  Date of Service: 11/25/2023  Chief Complaint  Patient presents with   Annual Exam    HPI Patsye presents for an annual well visit and physical exam.  Well-appearing 63 y.o. female with fatigue, anxiety, depression, ADHD, hypothyroidism and perimenopausal symptoms.  Routine CRC screening: due in 2027 Routine mammogram: done in September last year  DEXA scan: due in 2 years  Pap smear: due in 2029 Labs: due for routine labs  New or worsening pain: none  Other concerns: previously diagnosed with ADHD, used to take adderall. Feels like she needs to start taking it again, was on adderall 20 mg daily.     Current Medication: Outpatient Encounter Medications as of 11/25/2023  Medication Sig   [DISCONTINUED] amphetamine-dextroamphetamine (ADDERALL) 20 MG tablet Take 20 mg by mouth See admin instructions. Take 1 tablet (20 mg) by mouth scheduled in the morning, may take an additional dose in the evening if needed for focus   [DISCONTINUED] escitalopram (LEXAPRO) 5 MG tablet Take 1 tablet (5 mg total) by mouth daily.   [DISCONTINUED] levothyroxine (SYNTHROID) 50 MCG tablet Take 1 tablet (50 mcg total) by mouth daily. On empty stomach   [DISCONTINUED] PREMPRO 0.625-2.5 MG tablet TAKE 1 TABLET BY MOUTH EVERY DAY   amphetamine-dextroamphetamine (ADDERALL) 20 MG tablet Take 1 tablet (20 mg total) by mouth daily with breakfast.   [START ON 12/23/2023] amphetamine-dextroamphetamine (ADDERALL) 20 MG tablet Take 1 tablet (20 mg total) by mouth daily with breakfast.   [START ON 01/20/2024] amphetamine-dextroamphetamine (ADDERALL) 20 MG tablet Take 1 tablet (20 mg total) by mouth daily with breakfast.   escitalopram (LEXAPRO) 5 MG tablet Take 1 tablet (5 mg total) by mouth daily.   levothyroxine (SYNTHROID) 50 MCG tablet Take 1 tablet (50 mcg total)  by mouth daily. On empty stomach   PREMPRO 0.625-2.5 MG tablet Take 1 tablet by mouth daily.   No facility-administered encounter medications on file as of 11/25/2023.    Surgical History: Past Surgical History:  Procedure Laterality Date   BREAST SURGERY Bilateral    augmentation   HAND SURGERY Left    fused joint on first finger   TOTAL HIP ARTHROPLASTY Right 10/06/2019   Procedure: TOTAL HIP ARTHROPLASTY;  Surgeon: Christena Flake, MD;  Location: [facility] ORS;  Service: Orthopedics;  Laterality: Right;   TOTAL SHOULDER ARTHROPLASTY Left 12/21/2021   Procedure: TOTAL SHOULDER ARTHROPLASTY;  Surgeon: Christena Flake, MD;  Location: [facility] ORS;  Service: Orthopedics;  Laterality: Left;    Medical History: Past Medical History:  Diagnosis Date   ADHD (attention deficit hyperactivity disorder)    Arthritis    Basal cell carcinoma 02/15/2021   R glabella, MOHs, [facility] 03/14/21   Family history of adverse reaction to anesthesia    mom-hard time waking up   Seizure [facility])    only in her 20's due to medication    Family History: History reviewed. No pertinent family history.  Social History   Socioeconomic History   Marital status: Married    Spouse name: Not on file   Number of children: Not on file   Years of education: Not on file   Highest education level: Not on file  Occupational History   Not on file  Tobacco Use   Smoking status: Never   Smokeless tobacco: Never  Vaping Use  Vaping status: Never Used  Substance and Sexual Activity   Alcohol use: Yes    Comment: glass of wine a month   Drug use: Never   Sexual activity: Yes  Other Topics Concern   Not on file  Social History Narrative   Not on file   Social Drivers of Health   Financial Resource Strain: Not on file  Food Insecurity: Not on file  Transportation Needs: Not on file  Physical Activity: Not on file  Stress: Not on file  Social Connections: Not on file  Intimate Partner  Violence: Not on file      Review of Systems  Constitutional:  Positive for fatigue and unexpected weight change. Negative for activity change, appetite change, chills and fever.  HENT: Negative.  Negative for congestion, ear pain, rhinorrhea, sore throat and trouble swallowing.   Eyes: Negative.   Respiratory: Negative.  Negative for cough, chest tightness, shortness of breath and wheezing.   Cardiovascular: Negative.  Negative for chest pain and palpitations.  Gastrointestinal: Negative.  Negative for abdominal pain, blood in stool, constipation, diarrhea, nausea and vomiting.  Endocrine: Negative.   Genitourinary: Negative.  Negative for difficulty urinating, dysuria, frequency, hematuria and urgency.  Musculoskeletal:  Negative for arthralgias, back pain, joint swelling, myalgias and neck pain.  Skin: Negative.  Negative for rash and wound.  Allergic/Immunologic: Negative.  Negative for immunocompromised state.  Neurological: Negative.  Negative for dizziness, seizures, numbness and headaches.  Hematological: Negative.   Psychiatric/Behavioral:  Negative for behavioral problems, self-injury, sleep disturbance and suicidal ideas. The patient is nervous/anxious.     Vital Signs: BP 114/70   Pulse 60   Temp 98.2 F (36.8 C)   Resp 16   Ht 5' (1.524 m)   Wt 123 lb 9.6 oz (56.1 kg)   SpO2 96%   BMI 24.14 kg/m    Physical Exam Vitals reviewed.  Constitutional:      General: She is not in acute distress.    Appearance: She is well-developed. She is not diaphoretic.  HENT:     Head: Normocephalic and atraumatic.     Right Ear: External ear normal.     Left Ear: External ear normal.     Nose: Nose normal.     Mouth/Throat:     Pharynx: No oropharyngeal exudate.  Eyes:     General: No scleral icterus.       Right eye: No discharge.        Left eye: No discharge.     Conjunctiva/sclera: Conjunctivae normal.     Pupils: Pupils are equal, round, and reactive to light.   Neck:     Thyroid: No thyromegaly.     Vascular: No JVD.     Trachea: No tracheal deviation.  Cardiovascular:     Rate and Rhythm: Normal rate and regular rhythm.     Heart sounds: Normal heart sounds. No murmur heard.    No friction rub. No gallop.  Pulmonary:     Effort: Pulmonary effort is normal. No respiratory distress.     Breath sounds: Normal breath sounds. No stridor. No wheezing or rales.  Chest:     Chest wall: No tenderness.  Abdominal:     General: Bowel sounds are normal. There is no distension.     Palpations: Abdomen is soft. There is no mass.     Tenderness: There is no abdominal tenderness. There is no guarding or rebound.     Hernia: There is no hernia in the  left inguinal area or right inguinal area.  Genitourinary:    General: Normal vulva.     Pubic Area: No rash or pubic lice.      Labia:        Right: No rash, tenderness, lesion or injury.        Left: No rash, tenderness, lesion or injury.      Urethra: No prolapse, urethral swelling or urethral lesion.     Vagina: Normal. No signs of injury and foreign body. No vaginal discharge, erythema, tenderness, bleeding, lesions or prolapsed vaginal walls.     Cervix: Normal.     Uterus: Normal.      Adnexa: Right adnexa normal and left adnexa normal.     Rectum: Normal.  Musculoskeletal:        General: No tenderness or deformity. Normal range of motion.     Cervical back: Normal range of motion and neck supple.  Lymphadenopathy:     Cervical: No cervical adenopathy.     Lower Body: No right inguinal adenopathy. No left inguinal adenopathy.  Skin:    General: Skin is warm and dry.     Coloration: Skin is not pale.     Findings: No erythema or rash.  Neurological:     Mental Status: She is alert.     Cranial Nerves: No cranial nerve deficit.     Motor: No abnormal muscle tone.     Coordination: Coordination normal.     Deep Tendon Reflexes: Reflexes are normal and symmetric.  Psychiatric:         Behavior: Behavior normal.        Thought Content: Thought content normal.        Judgment: Judgment normal.        Assessment/Plan: 1. Encounter for routine adult health examination with abnormal findings (Primary) Age-appropriate preventive screenings and vaccinations discussed, annual physical exam completed. Routine labs for health maintenance ordered, see below. PHM updated.   - escitalopram (LEXAPRO) 5 MG tablet; Take 1 tablet (5 mg total) by mouth daily.  Dispense: 90 tablet; Refill: 1 - levothyroxine (SYNTHROID) 50 MCG tablet; Take 1 tablet (50 mcg total) by mouth daily. On empty stomach  Dispense: 90 tablet; Refill: 3 - PREMPRO 0.625-2.5 MG tablet; Take 1 tablet by mouth daily.  Dispense: 84 tablet; Refill: 1 - CBC with Differential/Platelet - CMP14+EGFR - Lipid Profile - TSH + free T4  2. Other specified hypothyroidism Routine labs ordered. Continue levothyroxine as prescribed.  - levothyroxine (SYNTHROID) 50 MCG tablet; Take 1 tablet (50 mcg total) by mouth daily. On empty stomach  Dispense: 90 tablet; Refill: 3 - CBC with Differential/Platelet - CMP14+EGFR - Lipid Profile - TSH + free T4  3. Mixed hyperlipidemia Routine labs ordered  - CBC with Differential/Platelet - CMP14+EGFR - Lipid Profile - TSH + free T4  4. Vasomotor symptoms due to menopause Continue prempro as prescribed.  - PREMPRO 0.625-2.5 MG tablet; Take 1 tablet by mouth daily.  Dispense: 84 tablet; Refill: 1  5. Depression, major, single episode, complete remission (HCC) Continue escitalopram as prescribed.  - escitalopram (LEXAPRO) 5 MG tablet; Take 1 tablet (5 mg total) by mouth daily.  Dispense: 90 tablet; Refill: 1  6. ADHD (attention deficit hyperactivity disorder), inattentive type Restart adderall as prescribed. Follow up in 3 months for additional refills.  - amphetamine-dextroamphetamine (ADDERALL) 20 MG tablet; Take 1 tablet (20 mg total) by mouth daily with breakfast.  Dispense: 30  tablet; Refill: 0 - amphetamine-dextroamphetamine (ADDERALL) 20  MG tablet; Take 1 tablet (20 mg total) by mouth daily with breakfast.  Dispense: 30 tablet; Refill: 0 - amphetamine-dextroamphetamine (ADDERALL) 20 MG tablet; Take 1 tablet (20 mg total) by mouth daily with breakfast.  Dispense: 30 tablet; Refill: 0     General Counseling: loriana samad understanding of the findings of todays visit and agrees with plan of treatment. I have discussed any further diagnostic evaluation that may be needed or ordered today. We also reviewed her medications today. she has been encouraged to call the office with any questions or concerns that should arise related to todays visit.    Orders Placed This Encounter  Procedures   CBC with Differential/Platelet   CMP14+EGFR   Lipid Profile   TSH + free T4    Meds ordered this encounter  Medications   escitalopram (LEXAPRO) 5 MG tablet    Sig: Take 1 tablet (5 mg total) by mouth daily.    Dispense:  90 tablet    Refill:  1    Please fill for 90 days.   levothyroxine (SYNTHROID) 50 MCG tablet    Sig: Take 1 tablet (50 mcg total) by mouth daily. On empty stomach    Dispense:  90 tablet    Refill:  3    Discotninue 25 mcg dose, fill new script now.   PREMPRO 0.625-2.5 MG tablet    Sig: Take 1 tablet by mouth daily.    Dispense:  84 tablet    Refill:  1   amphetamine-dextroamphetamine (ADDERALL) 20 MG tablet    Sig: Take 1 tablet (20 mg total) by mouth daily with breakfast.    Dispense:  30 tablet    Refill:  0    Fill new script today, fill for February.   amphetamine-dextroamphetamine (ADDERALL) 20 MG tablet    Sig: Take 1 tablet (20 mg total) by mouth daily with breakfast.    Dispense:  30 tablet    Refill:  0    Fill for march   amphetamine-dextroamphetamine (ADDERALL) 20 MG tablet    Sig: Take 1 tablet (20 mg total) by mouth daily with breakfast.    Dispense:  30 tablet    Refill:  0    Fill for april    Return in about 3 months  (around 02/12/2024) for F/U, ADHD med check, Tahara Ruffini PCP, eval new med.   Total time spent:30 Minutes Time spent includes review of chart, medications, test results, and follow up plan with the patient.   [place] Controlled Substance Database was reviewed by me.  This patient was seen by Sallyanne Kuster, FNP-C in collaboration with Dr. Beverely Risen as a part of collaborative care agreement.  Nadav Swindell R. Tedd Sias, MSN, FNP-C Internal medicine

## 2023-11-28 ENCOUNTER — Telehealth: Payer: Self-pay

## 2023-11-28 NOTE — Telephone Encounter (Signed)
 Patient notified of her PA approved for Amphetamines.

## 2023-12-13 LAB — LIPID PANEL
Chol/HDL Ratio: 2.8 ratio (ref 0.0–4.4)
Cholesterol, Total: 178 mg/dL (ref 100–199)
HDL: 63 mg/dL (ref >39)
LDL Chol Calc (NIH): 97 mg/dL (ref 0–99)
Triglycerides: 99 mg/dL (ref 0–149)
VLDL Cholesterol Cal: 18 mg/dL (ref 5–40)

## 2023-12-13 LAB — CMP14+EGFR
ALT: 7 IU/L (ref 0–32)
AST: 12 IU/L (ref 0–40)
Albumin: 4.1 g/dL (ref 3.9–4.9)
Alkaline Phosphatase: 35 IU/L — ABNORMAL LOW (ref 44–121)
BUN/Creatinine Ratio: 21 (ref 12–28)
BUN: 16 mg/dL (ref 8–27)
Bilirubin Total: 0.4 mg/dL (ref 0.0–1.2)
CO2: 24 mmol/L (ref 20–29)
Calcium: 9.1 mg/dL (ref 8.7–10.3)
Chloride: 103 mmol/L (ref 96–106)
Creatinine, Ser: 0.75 mg/dL (ref 0.57–1.00)
Globulin, Total: 2.2 g/dL (ref 1.5–4.5)
Glucose: 76 mg/dL (ref 70–99)
Potassium: 4.5 mmol/L (ref 3.5–5.2)
Sodium: 138 mmol/L (ref 134–144)
Total Protein: 6.3 g/dL (ref 6.0–8.5)
eGFR: 89 mL/min/{1.73_m2} (ref >59)

## 2023-12-13 LAB — CBC WITH DIFFERENTIAL/PLATELET
Basophils Absolute: 0 10*3/uL (ref 0.0–0.2)
Basos: 1 %
EOS (ABSOLUTE): 0.2 10*3/uL (ref 0.0–0.4)
Eos: 5 %
Hematocrit: 40.5 % (ref 34.0–46.6)
Hemoglobin: 13.4 g/dL (ref 11.1–15.9)
Immature Grans (Abs): 0 10*3/uL (ref 0.0–0.1)
Immature Granulocytes: 0 %
Lymphocytes Absolute: 1.2 10*3/uL (ref 0.7–3.1)
Lymphs: 29 %
MCH: 31.6 pg (ref 26.6–33.0)
MCHC: 33.1 g/dL (ref 31.5–35.7)
MCV: 96 fL (ref 79–97)
Monocytes Absolute: 0.4 10*3/uL (ref 0.1–0.9)
Monocytes: 9 %
Neutrophils Absolute: 2.3 10*3/uL (ref 1.4–7.0)
Neutrophils: 56 %
Platelets: 200 10*3/uL (ref 150–450)
RBC: 4.24 x10E6/uL (ref 3.77–5.28)
RDW: 12 % (ref 11.7–15.4)
WBC: 4.2 10*3/uL (ref 3.4–10.8)

## 2023-12-13 LAB — TSH+FREE T4
Free T4: 1.15 ng/dL (ref 0.82–1.77)
TSH: 1.91 u[IU]/mL (ref 0.450–4.500)

## 2023-12-17 ENCOUNTER — Ambulatory Visit (INDEPENDENT_AMBULATORY_CARE_PROVIDER_SITE_OTHER): Payer: Self-pay

## 2023-12-17 DIAGNOSIS — Z111 Encounter for screening for respiratory tuberculosis: Secondary | ICD-10-CM

## 2023-12-19 LAB — TB SKIN TEST
Induration: 0 mm
TB Skin Test: NEGATIVE

## 2023-12-29 ENCOUNTER — Encounter: Payer: Self-pay | Admitting: Nurse Practitioner

## 2023-12-29 DIAGNOSIS — E038 Other specified hypothyroidism: Secondary | ICD-10-CM | POA: Insufficient documentation

## 2023-12-29 DIAGNOSIS — E782 Mixed hyperlipidemia: Secondary | ICD-10-CM | POA: Insufficient documentation

## 2023-12-29 DIAGNOSIS — N951 Menopausal and female climacteric states: Secondary | ICD-10-CM | POA: Insufficient documentation

## 2023-12-29 DIAGNOSIS — F325 Major depressive disorder, single episode, in full remission: Secondary | ICD-10-CM | POA: Insufficient documentation

## 2024-03-02 ENCOUNTER — Ambulatory Visit: Payer: 59 | Admitting: Nurse Practitioner

## 2024-03-02 ENCOUNTER — Encounter: Payer: Self-pay | Admitting: Nurse Practitioner

## 2024-03-02 VITALS — BP 110/68 | HR 60 | Temp 97.8°F | Resp 16 | Ht 60.0 in | Wt 124.0 lb

## 2024-03-02 DIAGNOSIS — N951 Menopausal and female climacteric states: Secondary | ICD-10-CM

## 2024-03-02 DIAGNOSIS — F900 Attention-deficit hyperactivity disorder, predominantly inattentive type: Secondary | ICD-10-CM

## 2024-03-02 DIAGNOSIS — E038 Other specified hypothyroidism: Secondary | ICD-10-CM | POA: Diagnosis not present

## 2024-03-02 DIAGNOSIS — F325 Major depressive disorder, single episode, in full remission: Secondary | ICD-10-CM | POA: Diagnosis not present

## 2024-03-02 MED ORDER — ESCITALOPRAM OXALATE 5 MG PO TABS: 5.0000 mg | ORAL_TABLET | Freq: Every day | ORAL | 1 refills | Status: DC

## 2024-03-02 MED ORDER — AMPHETAMINE-DEXTROAMPHETAMINE 20 MG PO TABS: 20.0000 mg | ORAL_TABLET | Freq: Every day | ORAL | 0 refills | Status: DC

## 2024-03-02 MED ORDER — PREMPRO 0.625-2.5 MG PO TABS
1.0000 | ORAL_TABLET | Freq: Every day | ORAL | 1 refills | Status: DC
Start: 2024-03-02 — End: 2024-08-20

## 2024-03-02 NOTE — Progress Notes (Signed)
 [facility] [address] [place], [place] [address]  Internal MEDICINE  Office Visit Note  Patient Name: Maria Donaldson  [id number]  [id number]  Date of Service: 03/02/2024  Chief Complaint  Patient presents with   Follow-up    HPI Maria Donaldson presents for a follow-up visit for hypothyroidism, depression, menopause, and ADHD.  Hypothyroidism -- her thyroid labs are stable, currently taking levothyroxine  50 mcg daily Depression -- lexapro  is working well, no issues.  Vasomotor symptoms of menopause -- taking prempro  which is helping a lot with her symptoms ADHD -- current dose is effective. BP and heart rate are normal. Denies any palpitations or other adverse side effects of the medication. Due for refills Fatigue -- most likely due to multiple factors -- menopause, ADHD, depression, medication side effects as well.     Current Medication: Outpatient Encounter Medications as of 03/02/2024  Medication Sig   levothyroxine  (SYNTHROID ) 50 MCG tablet Take 1 tablet (50 mcg total) by mouth daily. On empty stomach   [DISCONTINUED] amphetamine -dextroamphetamine  (ADDERALL) 20 MG tablet Take 1 tablet (20 mg total) by mouth daily with breakfast.   [DISCONTINUED] amphetamine -dextroamphetamine  (ADDERALL) 20 MG tablet Take 1 tablet (20 mg total) by mouth daily with breakfast.   [DISCONTINUED] amphetamine -dextroamphetamine  (ADDERALL) 20 MG tablet Take 1 tablet (20 mg total) by mouth daily with breakfast.   [DISCONTINUED] escitalopram  (LEXAPRO ) 5 MG tablet Take 1 tablet (5 mg total) by mouth daily.   [DISCONTINUED] PREMPRO  0.625-2.5 MG tablet Take 1 tablet by mouth daily.   [START ON 04/27/2024] amphetamine -dextroamphetamine  (ADDERALL) 20 MG tablet Take 1 tablet (20 mg total) by mouth daily with breakfast.   [START ON 03/30/2024] amphetamine -dextroamphetamine  (ADDERALL) 20 MG tablet Take 1 tablet (20 mg total) by mouth daily with breakfast.   amphetamine -dextroamphetamine  (ADDERALL) 20 MG tablet  Take 1 tablet (20 mg total) by mouth daily with breakfast.   escitalopram  (LEXAPRO ) 5 MG tablet Take 1 tablet (5 mg total) by mouth daily.   PREMPRO  0.625-2.5 MG tablet Take 1 tablet by mouth daily.   No facility-administered encounter medications on file as of 03/02/2024.    Surgical History: Past Surgical History:  Procedure Laterality Date   BREAST SURGERY Bilateral    augmentation   HAND SURGERY Left    fused joint on first finger   TOTAL HIP ARTHROPLASTY Right 10/06/2019   Procedure: TOTAL HIP ARTHROPLASTY;  Surgeon: Edie Norleen PARAS, MD;  Location: [facility] ORS;  Service: Orthopedics;  Laterality: Right;   TOTAL SHOULDER ARTHROPLASTY Left 12/21/2021   Procedure: TOTAL SHOULDER ARTHROPLASTY;  Surgeon: Edie Norleen PARAS, MD;  Location: [facility] ORS;  Service: Orthopedics;  Laterality: Left;    Medical History: Past Medical History:  Diagnosis Date   ADHD (attention deficit hyperactivity disorder)    Arthritis    Basal cell carcinoma 02/15/2021   R glabella, MOHs, [facility] 03/14/21   Family history of adverse reaction to anesthesia    mom-hard time waking up   Seizure [facility])    only in her 20's due to medication    Family History: History reviewed. No pertinent family history.  Social History   Socioeconomic History   Marital status: Married    Spouse name: Not on file   Number of children: Not on file   Years of education: Not on file   Highest education level: Not on file  Occupational History   Not on file  Tobacco Use   Smoking status: Never   Smokeless tobacco: Never  Vaping Use  Vaping status: Never Used  Substance and Sexual Activity   Alcohol use: Yes    Comment: glass of wine a month   Drug use: Never   Sexual activity: Yes  Other Topics Concern   Not on file  Social History Narrative   Not on file   Social Drivers of Health   Financial Resource Strain: Not on file  Food Insecurity: Not on file  Transportation Needs: Not on file   Physical Activity: Not on file  Stress: Not on file  Social Connections: Not on file  Intimate Partner Violence: Not on file      Review of Systems  Constitutional:  Positive for appetite change (improving) and fatigue.  Respiratory: Negative.  Negative for cough, chest tightness, shortness of breath and wheezing.   Cardiovascular: Negative.  Negative for chest pain and palpitations.  Gastrointestinal: Negative.   Musculoskeletal: Negative.   Neurological:  Positive for dizziness.  Psychiatric/Behavioral:  Positive for behavioral problems (improving) and sleep disturbance. Negative for self-injury and suicidal ideas. The patient is nervous/anxious (improving).     Vital Signs: BP 110/68   Pulse (!) 56   Temp 97.8 F (36.6 C)   Resp 16   Ht 5' (1.524 m)   Wt 124 lb (56.2 kg)   SpO2 97%   BMI 24.22 kg/m    Physical Exam Vitals reviewed.  Constitutional:      General: She is not in acute distress.    Appearance: Normal appearance. She is normal weight. She is not ill-appearing.  HENT:     Head: Normocephalic and atraumatic.  Eyes:     Pupils: Pupils are equal, round, and reactive to light.  Cardiovascular:     Rate and Rhythm: Normal rate and regular rhythm.  Pulmonary:     Effort: Pulmonary effort is normal. No respiratory distress.  Neurological:     Mental Status: She is alert and oriented to person, place, and time.  Psychiatric:        Mood and Affect: Mood normal.        Behavior: Behavior normal.        Assessment/Plan: 1. Other specified hypothyroidism (Primary) Thyroid labs are stable, continue current levothyroxine  dose as prescribed.   2. Vasomotor symptoms due to menopause Continue prempro  as prescribed.  - PREMPRO  0.625-2.5 MG tablet; Take 1 tablet by mouth daily.  Dispense: 84 tablet; Refill: 1  3. Depression, major, single episode, complete remission (HCC) Doing well with lexapro , continue as prescribed. - escitalopram  (LEXAPRO ) 5 MG  tablet; Take 1 tablet (5 mg total) by mouth daily.  Dispense: 90 tablet; Refill: 1  4. ADHD (attention deficit hyperactivity disorder), inattentive type Continue adderall as prescribed, follow up in 3 months for additional refills  - amphetamine -dextroamphetamine  (ADDERALL) 20 MG tablet; Take 1 tablet (20 mg total) by mouth daily with breakfast.  Dispense: 30 tablet; Refill: 0 - amphetamine -dextroamphetamine  (ADDERALL) 20 MG tablet; Take 1 tablet (20 mg total) by mouth daily with breakfast.  Dispense: 30 tablet; Refill: 0 - amphetamine -dextroamphetamine  (ADDERALL) 20 MG tablet; Take 1 tablet (20 mg total) by mouth daily with breakfast.  Dispense: 30 tablet; Refill: 0   General Counseling: zettie gootee understanding of the findings of todays visit and agrees with plan of treatment. I have discussed any further diagnostic evaluation that may be needed or ordered today. We also reviewed her medications today. she has been encouraged to call the office with any questions or concerns that should arise related to todays visit.  No orders of the defined types were placed in this encounter.   Meds ordered this encounter  Medications   PREMPRO  0.625-2.5 MG tablet    Sig: Take 1 tablet by mouth daily.    Dispense:  84 tablet    Refill:  1   escitalopram  (LEXAPRO ) 5 MG tablet    Sig: Take 1 tablet (5 mg total) by mouth daily.    Dispense:  90 tablet    Refill:  1    Please fill for 90 days.   amphetamine -dextroamphetamine  (ADDERALL) 20 MG tablet    Sig: Take 1 tablet (20 mg total) by mouth daily with breakfast.    Dispense:  30 tablet    Refill:  0    Fill for July 28   amphetamine -dextroamphetamine  (ADDERALL) 20 MG tablet    Sig: Take 1 tablet (20 mg total) by mouth daily with breakfast.    Dispense:  30 tablet    Refill:  0    Fill for June 30   amphetamine -dextroamphetamine  (ADDERALL) 20 MG tablet    Sig: Take 1 tablet (20 mg total) by mouth daily with breakfast.    Dispense:  30  tablet    Refill:  0    Fill for June 2    Return in about 3 months (around 05/28/2024) for F/U, ADHD med check, Lula Kolton PCP.   Total time spent:30 Minutes Time spent includes review of chart, medications, test results, and follow up plan with the patient.   [place] Controlled Substance Database was reviewed by me.  This patient was seen by Mardy Maxin, FNP-C in collaboration with Dr. Sigrid Bathe as a part of collaborative care agreement.   Marios Gaiser R. Maxin, MSN, FNP-C Internal medicine

## 2024-03-26 ENCOUNTER — Encounter: Payer: Self-pay | Admitting: Nurse Practitioner

## 2024-04-30 ENCOUNTER — Other Ambulatory Visit: Payer: Self-pay | Admitting: Medical Genetics

## 2024-05-01 ENCOUNTER — Other Ambulatory Visit
Admission: RE | Admit: 2024-05-01 | Discharge: 2024-05-01 | Disposition: A | Discharge status: Home / Self Care | Payer: Self-pay | Source: Ambulatory Visit | Attending: Medical Genetics | Admitting: Medical Genetics

## 2024-05-12 LAB — GENECONNECT MOLECULAR SCREEN: Genetic Analysis Overall Interpretation: NEGATIVE

## 2024-05-28 ENCOUNTER — Encounter: Payer: Self-pay | Admitting: Nurse Practitioner

## 2024-05-28 ENCOUNTER — Ambulatory Visit: Admitting: Nurse Practitioner

## 2024-05-28 VITALS — BP 110/76 | HR 60 | Temp 97.9°F | Resp 16 | Ht 60.0 in | Wt 121.8 lb

## 2024-05-28 DIAGNOSIS — E038 Other specified hypothyroidism: Secondary | ICD-10-CM | POA: Diagnosis not present

## 2024-05-28 DIAGNOSIS — F900 Attention-deficit hyperactivity disorder, predominantly inattentive type: Secondary | ICD-10-CM | POA: Diagnosis not present

## 2024-05-28 DIAGNOSIS — N951 Menopausal and female climacteric states: Secondary | ICD-10-CM

## 2024-05-28 DIAGNOSIS — F325 Major depressive disorder, single episode, in full remission: Secondary | ICD-10-CM

## 2024-05-28 MED ORDER — AMPHETAMINE-DEXTROAMPHETAMINE 20 MG PO TABS: 20.0000 mg | ORAL_TABLET | Freq: Every day | ORAL | 0 refills | Status: DC

## 2024-05-28 NOTE — Progress Notes (Signed)
 [facility] [address] [place], [place] [address]  Internal MEDICINE  Office Visit Note  Patient Name: Maria Donaldson  [id number]  [id number]  Date of Service: 05/28/2024  Chief Complaint  Patient presents with   Follow-up    HPI Maria Donaldson presents for a follow-up visit for ADHD, vasomotor symptoms of menopause, depression, and hypothyroidism, and refills.  ADHD -- current dose remains effective. BP and heart rate are normal. Denies palpitations or other adverse side effects of the medication. Due for refills.  Vasomotor symptoms of menopause -- taking prempro  which is working well for her.  Depression -- taking lexapro  daily which is effective at controlling her symptoms.  Hypothyroidism -- on levothyroxine  50 mcg daily.     Current Medication: Outpatient Encounter Medications as of 05/28/2024  Medication Sig   [START ON 07/27/2024] amphetamine -dextroamphetamine  (ADDERALL) 20 MG tablet Take 1 tablet (20 mg total) by mouth daily with breakfast.   [START ON 06/29/2024] amphetamine -dextroamphetamine  (ADDERALL) 20 MG tablet Take 1 tablet (20 mg total) by mouth daily with breakfast.   [START ON 06/01/2024] amphetamine -dextroamphetamine  (ADDERALL) 20 MG tablet Take 1 tablet (20 mg total) by mouth daily with breakfast.   escitalopram  (LEXAPRO ) 5 MG tablet Take 1 tablet (5 mg total) by mouth daily.   levothyroxine  (SYNTHROID ) 50 MCG tablet Take 1 tablet (50 mcg total) by mouth daily. On empty stomach   PREMPRO  0.625-2.5 MG tablet Take 1 tablet by mouth daily.   [DISCONTINUED] amphetamine -dextroamphetamine  (ADDERALL) 20 MG tablet Take 1 tablet (20 mg total) by mouth daily with breakfast.   [DISCONTINUED] amphetamine -dextroamphetamine  (ADDERALL) 20 MG tablet Take 1 tablet (20 mg total) by mouth daily with breakfast.   [DISCONTINUED] amphetamine -dextroamphetamine  (ADDERALL) 20 MG tablet Take 1 tablet (20 mg total) by mouth daily with breakfast.   No facility-administered encounter  medications on file as of 05/28/2024.    Surgical History: Past Surgical History:  Procedure Laterality Date   BREAST SURGERY Bilateral    augmentation   HAND SURGERY Left    fused joint on first finger   TOTAL HIP ARTHROPLASTY Right 10/06/2019   Procedure: TOTAL HIP ARTHROPLASTY;  Surgeon: Edie Norleen PARAS, MD;  Location: [facility] ORS;  Service: Orthopedics;  Laterality: Right;   TOTAL SHOULDER ARTHROPLASTY Left 12/21/2021   Procedure: TOTAL SHOULDER ARTHROPLASTY;  Surgeon: Edie Norleen PARAS, MD;  Location: [facility] ORS;  Service: Orthopedics;  Laterality: Left;    Medical History: Past Medical History:  Diagnosis Date   ADHD (attention deficit hyperactivity disorder)    Arthritis    Basal cell carcinoma 02/15/2021   R glabella, MOHs, [facility] 03/14/21   Family history of adverse reaction to anesthesia    mom-hard time waking up   Seizure [facility])    only in her 20's due to medication    Family History: History reviewed. No pertinent family history.  Social History   Socioeconomic History   Marital status: Married    Spouse name: Not on file   Number of children: Not on file   Years of education: Not on file   Highest education level: Not on file  Occupational History   Not on file  Tobacco Use   Smoking status: Never   Smokeless tobacco: Never  Vaping Use   Vaping status: Never Used  Substance and Sexual Activity   Alcohol use: Yes    Comment: glass of wine a month   Drug use: Never   Sexual activity: Yes  Other Topics Concern   Not on  file  Social History Narrative   Not on file   Social Drivers of Health   Financial Resource Strain: Not on file  Food Insecurity: Not on file  Transportation Needs: Not on file  Physical Activity: Not on file  Stress: Not on file  Social Connections: Not on file  Intimate Partner Violence: Not on file      Review of Systems  Constitutional:  Positive for appetite change (improving) and fatigue.  Respiratory:  Negative.  Negative for cough, chest tightness, shortness of breath and wheezing.   Cardiovascular: Negative.  Negative for chest pain and palpitations.  Gastrointestinal: Negative.   Musculoskeletal: Negative.   Neurological:  Positive for dizziness.  Psychiatric/Behavioral:  Positive for behavioral problems (improving) and sleep disturbance. Negative for self-injury and suicidal ideas. The patient is nervous/anxious (improving).     Vital Signs: BP 110/76   Pulse 60   Temp 97.9 F (36.6 C)   Resp 16   Ht 5' (1.524 m)   Wt 121 lb 12.8 oz (55.2 kg)   SpO2 97%   BMI 23.79 kg/m    Physical Exam Vitals reviewed.  Constitutional:      General: She is not in acute distress.    Appearance: Normal appearance. She is normal weight. She is not ill-appearing.  HENT:     Head: Normocephalic and atraumatic.  Eyes:     Pupils: Pupils are equal, round, and reactive to light.  Cardiovascular:     Rate and Rhythm: Normal rate and regular rhythm.  Pulmonary:     Effort: Pulmonary effort is normal. No respiratory distress.  Neurological:     Mental Status: She is alert and oriented to person, place, and time.  Psychiatric:        Mood and Affect: Mood normal.        Behavior: Behavior normal.        Assessment/Plan: 1. Other specified hypothyroidism (Primary) Continue levothyroxine  50 mcg daily as prescribed   2. Vasomotor symptoms due to menopause Continue prempro  as prescribed.   3. Depression, major, single episode, complete remission Continue lexapro  as prescribed   4. ADHD (attention deficit hyperactivity disorder), inattentive type Continue adderall as prescribed, follow up in 3 months for additional refills. - amphetamine -dextroamphetamine  (ADDERALL) 20 MG tablet; Take 1 tablet (20 mg total) by mouth daily with breakfast.  Dispense: 30 tablet; Refill: 0 - amphetamine -dextroamphetamine  (ADDERALL) 20 MG tablet; Take 1 tablet (20 mg total) by mouth daily with breakfast.   Dispense: 30 tablet; Refill: 0 - amphetamine -dextroamphetamine  (ADDERALL) 20 MG tablet; Take 1 tablet (20 mg total) by mouth daily with breakfast.  Dispense: 30 tablet; Refill: 0   General Counseling: jani moronta understanding of the findings of todays visit and agrees with plan of treatment. I have discussed any further diagnostic evaluation that may be needed or ordered today. We also reviewed her medications today. she has been encouraged to call the office with any questions or concerns that should arise related to todays visit.    No orders of the defined types were placed in this encounter.   Meds ordered this encounter  Medications   amphetamine -dextroamphetamine  (ADDERALL) 20 MG tablet    Sig: Take 1 tablet (20 mg total) by mouth daily with breakfast.    Dispense:  30 tablet    Refill:  0    Fill for late October.   amphetamine -dextroamphetamine  (ADDERALL) 20 MG tablet    Sig: Take 1 tablet (20 mg total) by mouth daily with breakfast.  Dispense:  30 tablet    Refill:  0    Fill for late September   amphetamine -dextroamphetamine  (ADDERALL) 20 MG tablet    Sig: Take 1 tablet (20 mg total) by mouth daily with breakfast.    Dispense:  30 tablet    Refill:  0    Fill for september    Return in about 3 months (around 08/19/2024) for F/U, ADHD med check, Sheriann Newmann PCP.   Total time spent:30 Minutes Time spent includes review of chart, medications, test results, and follow up plan with the patient.   [place] Controlled Substance Database was reviewed by me.  This patient was seen by Mardy Maxin, FNP-C in collaboration with Dr. Sigrid Bathe as a part of collaborative care agreement.   Neithan Day R. Maxin, MSN, FNP-C Internal medicine

## 2024-07-11 ENCOUNTER — Encounter: Payer: Self-pay | Admitting: Nurse Practitioner

## 2024-07-11 DIAGNOSIS — F900 Attention-deficit hyperactivity disorder, predominantly inattentive type: Secondary | ICD-10-CM | POA: Insufficient documentation

## 2024-08-20 ENCOUNTER — Encounter: Payer: Self-pay | Admitting: Nurse Practitioner

## 2024-08-20 ENCOUNTER — Ambulatory Visit: Admitting: Nurse Practitioner

## 2024-08-20 VITALS — BP 130/70 | HR 57 | Temp 98.0°F | Resp 16 | Ht 60.0 in | Wt 122.4 lb

## 2024-08-20 DIAGNOSIS — F900 Attention-deficit hyperactivity disorder, predominantly inattentive type: Secondary | ICD-10-CM | POA: Diagnosis not present

## 2024-08-20 DIAGNOSIS — F325 Major depressive disorder, single episode, in full remission: Secondary | ICD-10-CM | POA: Diagnosis not present

## 2024-08-20 DIAGNOSIS — N951 Menopausal and female climacteric states: Secondary | ICD-10-CM | POA: Diagnosis not present

## 2024-08-20 DIAGNOSIS — E038 Other specified hypothyroidism: Secondary | ICD-10-CM

## 2024-08-20 MED ORDER — AMPHETAMINE-DEXTROAMPHETAMINE 20 MG PO TABS: 20.0000 mg | ORAL_TABLET | Freq: Every day | ORAL | 0 refills | Status: AC

## 2024-08-20 MED ORDER — PREMPRO 0.625-2.5 MG PO TABS: 1.0000 | ORAL_TABLET | Freq: Every day | ORAL | 1 refills | Status: AC

## 2024-08-20 MED ORDER — ESCITALOPRAM OXALATE 5 MG PO TABS: 5.0000 mg | ORAL_TABLET | Freq: Every day | ORAL | 1 refills | Status: AC

## 2024-08-20 NOTE — Progress Notes (Signed)
 [facility] [address] [place], [place] [address]  Internal MEDICINE  Office Visit Note  Patient Name: Maria Donaldson  [id number]  [id number]  Date of Service: 08/20/2024  Chief Complaint  Patient presents with   Follow-up    HPI Leonila presents for a follow-up visit for ADHD, vasomotor symptoms, depression, and hypothyroidism.  ADHD -- current dose remains effective. BP and heart rate are normal. Denies palpitations or other adverse side effects of the medication. Due for refills.  Vasomotor symptoms of menopause -- taking prempro  which is working well for her. Due for refills   Depression -- taking lexapro  daily which is effective at controlling her symptoms. Due for refills   Hypothyroidism -- on levothyroxine  50 mcg daily.  Increased stress related to work right now     Current Medication: Outpatient Encounter Medications as of 08/20/2024  Medication Sig   amphetamine -dextroamphetamine  (ADDERALL) 20 MG tablet Take 1 tablet (20 mg total) by mouth daily with breakfast.   [START ON 09/17/2024] amphetamine -dextroamphetamine  (ADDERALL) 20 MG tablet Take 1 tablet (20 mg total) by mouth daily with breakfast.   amphetamine -dextroamphetamine  (ADDERALL) 20 MG tablet Take 1 tablet (20 mg total) by mouth daily with breakfast.   escitalopram  (LEXAPRO ) 5 MG tablet Take 1 tablet (5 mg total) by mouth daily.   levothyroxine  (SYNTHROID ) 50 MCG tablet Take 1 tablet (50 mcg total) by mouth daily. On empty stomach   PREMPRO  0.625-2.5 MG tablet Take 1 tablet by mouth daily.   [DISCONTINUED] amphetamine -dextroamphetamine  (ADDERALL) 20 MG tablet Take 1 tablet (20 mg total) by mouth daily with breakfast.   [DISCONTINUED] amphetamine -dextroamphetamine  (ADDERALL) 20 MG tablet Take 1 tablet (20 mg total) by mouth daily with breakfast.   [DISCONTINUED] amphetamine -dextroamphetamine  (ADDERALL) 20 MG tablet Take 1 tablet (20 mg total) by mouth daily with breakfast.   [DISCONTINUED]  escitalopram  (LEXAPRO ) 5 MG tablet Take 1 tablet (5 mg total) by mouth daily.   [DISCONTINUED] PREMPRO  0.625-2.5 MG tablet Take 1 tablet by mouth daily.   No facility-administered encounter medications on file as of 08/20/2024.    Surgical History: Past Surgical History:  Procedure Laterality Date   BREAST SURGERY Bilateral    augmentation   HAND SURGERY Left    fused joint on first finger   TOTAL HIP ARTHROPLASTY Right 10/06/2019   Procedure: TOTAL HIP ARTHROPLASTY;  Surgeon: Edie Norleen PARAS, MD;  Location: [facility];  Service: Orthopedics;  Laterality: Right;   TOTAL SHOULDER ARTHROPLASTY Left 12/21/2021   Procedure: TOTAL SHOULDER ARTHROPLASTY;  Surgeon: Edie Norleen PARAS, MD;  Location: [facility] ORS;  Service: Orthopedics;  Laterality: Left;    Medical History: Past Medical History:  Diagnosis Date   ADHD (attention deficit hyperactivity disorder)    Arthritis    Basal cell carcinoma 02/15/2021   R glabella, MOHs, [facility] 03/14/21   Family history of adverse reaction to anesthesia    mom-hard time waking up   Seizure [facility])    only in her 20's due to medication    Family History: History reviewed. No pertinent family history.  Social History   Socioeconomic History   Marital status: Married    Spouse name: Not on file   Number of children: Not on file   Years of education: Not on file   Highest education level: Not on file  Occupational History   Not on file  Tobacco Use   Smoking status: Never   Smokeless tobacco: Never  Vaping Use   Vaping status: Never Used  Substance and Sexual Activity   Alcohol use: Not Currently    Comment: glass of wine a month   Drug use: Never   Sexual activity: Yes  Other Topics Concern   Not on file  Social History Narrative   Not on file   Social Drivers of Health   Financial Resource Strain: Not on file  Food Insecurity: Not on file  Transportation Needs: Not on file  Physical Activity: Not on file  Stress:  Not on file  Social Connections: Not on file  Intimate Partner Violence: Not on file      Review of Systems  Constitutional:  Positive for appetite change (improving) and fatigue.  Respiratory: Negative.  Negative for cough, chest tightness, shortness of breath and wheezing.   Cardiovascular: Negative.  Negative for chest pain and palpitations.  Gastrointestinal: Negative.   Musculoskeletal: Negative.   Neurological:  Positive for dizziness.  Psychiatric/Behavioral:  Positive for behavioral problems (improving) and sleep disturbance. Negative for self-injury and suicidal ideas. The patient is nervous/anxious (improving).     Vital Signs: BP 130/70   Pulse (!) 57   Temp 98 F (36.7 C)   Resp 16   Ht 5' (1.524 m)   Wt 122 lb 6.4 oz (55.5 kg)   SpO2 99%   BMI 23.90 kg/m    Physical Exam Vitals reviewed.  Constitutional:      General: She is not in acute distress.    Appearance: Normal appearance. She is normal weight. She is not ill-appearing.  HENT:     Head: Normocephalic and atraumatic.  Eyes:     Pupils: Pupils are equal, round, and reactive to light.  Cardiovascular:     Rate and Rhythm: Normal rate and regular rhythm.  Pulmonary:     Effort: Pulmonary effort is normal. No respiratory distress.  Neurological:     Mental Status: She is alert and oriented to person, place, and time.  Psychiatric:        Mood and Affect: Mood normal.        Behavior: Behavior normal.        Assessment/Plan: 1. Vasomotor symptoms due to menopause (Primary) Continue prempro  as prescribed.  - PREMPRO  0.625-2.5 MG tablet; Take 1 tablet by mouth daily.  Dispense: 84 tablet; Refill: 1  2. Other specified hypothyroidism Continue levothyroxine  as prescribed.   3. ADHD (attention deficit hyperactivity disorder), inattentive type Continue adderall as prescribed. Follow up in 3 months for additional refills.  - amphetamine -dextroamphetamine  (ADDERALL) 20 MG tablet; Take 1 tablet  (20 mg total) by mouth daily with breakfast.  Dispense: 30 tablet; Refill: 0 - amphetamine -dextroamphetamine  (ADDERALL) 20 MG tablet; Take 1 tablet (20 mg total) by mouth daily with breakfast.  Dispense: 30 tablet; Refill: 0 - amphetamine -dextroamphetamine  (ADDERALL) 20 MG tablet; Take 1 tablet (20 mg total) by mouth daily with breakfast.  Dispense: 30 tablet; Refill: 0  4. Depression, major, single episode, complete remission Continue escitalopram  as prescribed - escitalopram  (LEXAPRO ) 5 MG tablet; Take 1 tablet (5 mg total) by mouth daily.  Dispense: 90 tablet; Refill: 1   General Counseling: eleyna brugh understanding of the findings of todays visit and agrees with plan of treatment. I have discussed any further diagnostic evaluation that may be needed or ordered today. We also reviewed her medications today. she has been encouraged to call the office with any questions or concerns that should arise related to todays visit.    No orders of the defined types were placed in this encounter.  Meds ordered this encounter  Medications   amphetamine -dextroamphetamine  (ADDERALL) 20 MG tablet    Sig: Take 1 tablet (20 mg total) by mouth daily with breakfast.    Dispense:  30 tablet    Refill:  0    Fill for november   amphetamine -dextroamphetamine  (ADDERALL) 20 MG tablet    Sig: Take 1 tablet (20 mg total) by mouth daily with breakfast.    Dispense:  30 tablet    Refill:  0    Fill for December   amphetamine -dextroamphetamine  (ADDERALL) 20 MG tablet    Sig: Take 1 tablet (20 mg total) by mouth daily with breakfast.    Dispense:  30 tablet    Refill:  0    Fill for janaury   escitalopram  (LEXAPRO ) 5 MG tablet    Sig: Take 1 tablet (5 mg total) by mouth daily.    Dispense:  90 tablet    Refill:  1    Please fill for 90 days.   PREMPRO  0.625-2.5 MG tablet    Sig: Take 1 tablet by mouth daily.    Dispense:  84 tablet    Refill:  1    Return in about 3 months (around 11/11/2024)  for F/U, ADHD med check, Virna Livengood PCP, UDS due at next visit. .   Total time spent:30 Minutes Time spent includes review of chart, medications, test results, and follow up plan with the patient.   [place] Controlled Substance Database was reviewed by me.  This patient was seen by Mardy Maxin, FNP-C in collaboration with Dr. Sigrid Bathe as a part of collaborative care agreement.   Analiz Tvedt R. Maxin, MSN, FNP-C Internal medicine

## 2024-09-27 ENCOUNTER — Encounter: Payer: Self-pay | Admitting: Nurse Practitioner

## 2024-10-12 ENCOUNTER — Telehealth: Payer: Self-pay | Admitting: Nurse Practitioner

## 2024-10-12 NOTE — Telephone Encounter (Signed)
 Last pap & mammo results faxed to [facility] MD; [phone]. Request scanned-Toni

## 2024-11-25 ENCOUNTER — Encounter: Payer: 59 | Admitting: Nurse Practitioner
